# Patient Record
Sex: Female | Born: 1985 | Race: White | Hispanic: No | Marital: Married | Smoking: Never smoker
Health system: Southern US, Community
[De-identification: ages and names within clinical notes are randomized; demographics above are authoritative.]

## PROBLEM LIST (undated history)

## (undated) DIAGNOSIS — K118 Other diseases of salivary glands: Secondary | ICD-10-CM

## (undated) DIAGNOSIS — Z973 Presence of spectacles and contact lenses: Secondary | ICD-10-CM

## (undated) DIAGNOSIS — T7840XA Allergy, unspecified, initial encounter: Secondary | ICD-10-CM

## (undated) DIAGNOSIS — E669 Obesity, unspecified: Secondary | ICD-10-CM

## (undated) DIAGNOSIS — B019 Varicella without complication: Secondary | ICD-10-CM

## (undated) HISTORY — DX: Varicella without complication: B01.9

## (undated) HISTORY — DX: Allergy, unspecified, initial encounter: T78.40XA

## (undated) HISTORY — PX: WISDOM TOOTH EXTRACTION: SHX21

---

## 2014-08-20 ENCOUNTER — Ambulatory Visit (INDEPENDENT_AMBULATORY_CARE_PROVIDER_SITE_OTHER): Payer: 59 | Admitting: Internal Medicine

## 2014-08-20 ENCOUNTER — Encounter: Payer: Self-pay | Admitting: Internal Medicine

## 2014-08-20 VITALS — BP 124/82 | HR 92 | Temp 98.5°F | Ht 69.25 in | Wt 280.0 lb

## 2014-08-20 DIAGNOSIS — J302 Other seasonal allergic rhinitis: Secondary | ICD-10-CM | POA: Insufficient documentation

## 2014-08-20 DIAGNOSIS — Z0000 Encounter for general adult medical examination without abnormal findings: Secondary | ICD-10-CM

## 2014-08-20 DIAGNOSIS — K111 Hypertrophy of salivary gland: Secondary | ICD-10-CM | POA: Insufficient documentation

## 2014-08-20 LAB — CBC
HCT: 41.1 % (ref 36.0–46.0)
HEMOGLOBIN: 13.8 g/dL (ref 12.0–15.0)
MCHC: 33.7 g/dL (ref 30.0–36.0)
MCV: 86.5 fl (ref 78.0–100.0)
PLATELETS: 199 10*3/uL (ref 150.0–400.0)
RBC: 4.75 Mil/uL (ref 3.87–5.11)
RDW: 13.7 % (ref 11.5–15.5)
WBC: 5.8 10*3/uL (ref 4.0–10.5)

## 2014-08-20 LAB — LIPID PANEL
Cholesterol: 158 mg/dL (ref 0–200)
HDL: 48.9 mg/dL (ref >39.00)
LDL CALC: 93 mg/dL (ref 0–99)
NonHDL: 109.1
TRIGLYCERIDES: 82 mg/dL (ref 0.0–149.0)
Total CHOL/HDL Ratio: 3
VLDL: 16.4 mg/dL (ref 0.0–40.0)

## 2014-08-20 LAB — COMPREHENSIVE METABOLIC PANEL
ALK PHOS: 76 U/L (ref 39–117)
ALT: 13 U/L (ref 0–35)
AST: 14 U/L (ref 0–37)
Albumin: 3.8 g/dL (ref 3.5–5.2)
BUN: 11 mg/dL (ref 6–23)
CO2: 27 mEq/L (ref 19–32)
Calcium: 8.9 mg/dL (ref 8.4–10.5)
Chloride: 106 mEq/L (ref 96–112)
Creatinine, Ser: 0.68 mg/dL (ref 0.40–1.20)
GFR: 109.03 mL/min (ref >60.00)
Glucose, Bld: 89 mg/dL (ref 70–99)
Potassium: 4 mEq/L (ref 3.5–5.1)
Sodium: 139 mEq/L (ref 135–145)
Total Bilirubin: 0.5 mg/dL (ref 0.2–1.2)
Total Protein: 6.9 g/dL (ref 6.0–8.3)

## 2014-08-20 LAB — HEMOGLOBIN A1C: Hgb A1c MFr Bld: 5 % (ref 4.6–6.5)

## 2014-08-20 LAB — TSH: TSH: 0.84 u[IU]/mL (ref 0.35–4.50)

## 2014-08-20 NOTE — Progress Notes (Signed)
Pre visit review using our clinic review tool, if applicable. No additional management support is needed unless otherwise documented below in the visit note. 

## 2014-08-20 NOTE — Assessment & Plan Note (Signed)
Encouraged her to work on diet and exercise 

## 2014-08-20 NOTE — Progress Notes (Addendum)
HPI  Pt presents to the clinic today to establish care and for management of the conditions listed below. She is transferring care from Moishe Spice, [place] at [facility].  Flu: 03/2014 Tetanus: < 10 years ago LMP: 08/07/14 Pap Smear: 2012 Dentist: Biannually  Allergies: Seasonal. Will take Allegra OTC when needed.  Past Medical History  Diagnosis Date  . Chicken pox   . Allergy     Current Outpatient Prescriptions  Medication Sig Dispense Refill  . fexofenadine (ALLEGRA) 180 MG tablet Take 180 mg by mouth daily.     No current facility-administered medications for this visit.    No Known Allergies  Family History  Problem Relation Age of Onset  . Cancer Maternal Grandmother     Breast  . Alcohol abuse Maternal Grandfather   . Hyperlipidemia Paternal Grandfather     History   Social History  . Marital Status: Married    Spouse Name: N/A  . Number of Children: N/A  . Years of Education: N/A   Occupational History  . Not on file.   Social History Main Topics  . Smoking status: Never Smoker   . Smokeless tobacco: Never Used  . Alcohol Use: 0.0 oz/week    0 Standard drinks or equivalent per week     Comment: social  . Drug Use: Not on file  . Sexual Activity: Not on file   Other Topics Concern  . Not on file   Social History Narrative  . No narrative on file    ROS:  Constitutional: Denies fever, malaise, fatigue, headache or abrupt weight changes.  HEENT: Denies eye pain, eye redness, ear pain, ringing in the ears, wax buildup, runny nose, nasal congestion, bloody nose, or sore throat. Respiratory: Denies difficulty breathing, shortness of breath, cough or sputum production.   Cardiovascular: Denies chest pain, chest tightness, palpitations or swelling in the hands or feet.  Gastrointestinal: Denies abdominal pain, bloating, constipation, diarrhea or blood in the stool.  GU: Denies frequency, urgency, pain with urination, blood in urine, odor or  discharge. Musculoskeletal: Denies decrease in range of motion, difficulty with gait, muscle pain or joint pain and swelling.  Skin: Denies redness, rashes, lesions or ulcercations.  Neurological: Denies dizziness, difficulty with memory, difficulty with speech or problems with balance and coordination.  Psych: Denies anxiety, depression, SI/HI.  No other specific complaints in a complete review of systems (except as listed in HPI above).  PE:  BP 124/82 mmHg  Pulse 92  Temp(Src) 98.5 F (36.9 C) (Oral)  Ht 5' 9.25" (1.759 m)  Wt 280 lb (127.007 kg)  BMI 41.05 kg/m2  SpO2 98%  LMP 08/07/2014 Wt Readings from Last 3 Encounters:  08/20/14 280 lb (127.007 kg)    General: Appears her stated age, obese in NAD. HEENT: Head: normal shape and size; Eyes: sclera white, no icterus, conjunctiva pink, PERRLA and EOMs intact; Ears: Tm's gray and intact, normal light reflex; Nose: mucosa pink and moist, septum midline; Throat/Mouth: Teeth present, mucosa pink and moist, no lesions or ulcerations noted.  Neck: Neck supple, trachea midline. Swollen parotid gland noted on the right. No thyromegaly present.  Cardiovascular: Normal rate and rhythm. S1,S2 noted.  No murmur, rubs or gallops noted. No JVD or BLE edema. No carotid bruits noted. Pulmonary/Chest: Normal effort and positive vesicular breath sounds. No respiratory distress. No wheezes, rales or ronchi noted.  Abdomen: Soft and nontender. Normal bowel sounds, no bruits noted. No distention or masses noted. Liver, spleen and kidneys non  palpable. Musculoskeletal: Normal range of motion. Strength 5/5 BUE/BLE. No difficulty with gait.  Neurological: Alert and oriented. Cranial nerves II-XII grossly intact. Coordination normal.  Psychiatric: Mood and affect normal. Behavior is normal. Judgment and thought content normal.     Assessment and Plan:  Preventative Health Maintenance:  Encouraged her to work on diet and exercise Will check CBC,  CMET, TSH, Lipid and A1C Continue to see a dentist biannually  RTC in 1 year or sooner if needed

## 2014-08-20 NOTE — Patient Instructions (Signed)

## 2014-08-20 NOTE — Assessment & Plan Note (Signed)
On the right She has had this evaluated She opted not to do surgery Will continue to monitor

## 2014-08-20 NOTE — Assessment & Plan Note (Signed)
Continue Allegra as needed 

## 2014-09-20 ENCOUNTER — Ambulatory Visit (INDEPENDENT_AMBULATORY_CARE_PROVIDER_SITE_OTHER): Payer: 59 | Admitting: Obstetrics & Gynecology

## 2014-09-20 ENCOUNTER — Encounter: Payer: Self-pay | Admitting: Obstetrics & Gynecology

## 2014-09-20 VITALS — BP 116/80 | HR 73 | Resp 16 | Ht 69.0 in | Wt 280.0 lb

## 2014-09-20 DIAGNOSIS — Z01419 Encounter for gynecological examination (general) (routine) without abnormal findings: Secondary | ICD-10-CM | POA: Diagnosis not present

## 2014-09-20 DIAGNOSIS — Z124 Encounter for screening for malignant neoplasm of cervix: Secondary | ICD-10-CM | POA: Diagnosis not present

## 2014-09-20 DIAGNOSIS — Z1151 Encounter for screening for human papillomavirus (HPV): Secondary | ICD-10-CM

## 2014-09-20 NOTE — Progress Notes (Signed)
Subjective:    Cassidy [name] is a 29 y.o. MW G32  female who presents for an annual exam. The patient has no complaints today. The patient is sexually active. GYN screening history: last pap: was normal. The patient wears seatbelts: yes. The patient participates in regular exercise: yes. Has the patient ever been transfused or tattooed?: no. The patient reports that there is not domestic violence in her life.   Menstrual History: OB History    Gravida Para Term Preterm AB TAB SAB Ectopic Multiple Living   0 0 0 0 0 0 0 0 0 0       Menarche age: 89  Patient's last menstrual period was 09/03/2014.    The following portions of the patient's history were reviewed and updated as appropriate: allergies, current medications, past family history, past medical history, past social history, past surgical history and problem list.  Review of Systems A comprehensive review of systems was negative. 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 Married for 5 years, uses condoms for contraception, happy with this method. Denies dyspareunia. Considering pregnancy in the next year or so. MGM with breast cancer   Objective:    BP 116/80 mmHg  Pulse 73  Resp 16  Ht 5\' 9"  (1.753 m)  Wt 280 lb (127.007 kg)  BMI 41.33 kg/m2  LMP 09/03/2014  General Appearance:    Alert, cooperative, no distress, appears stated age  Head:    Normocephalic, without obvious abnormality, atraumatic  Eyes:    PERRL, conjunctiva/corneas clear, EOM's intact, fundi    benign, both eyes  Ears:    Normal TM's and external ear canals, both ears  Nose:   Nares normal, septum midline, mucosa normal, no drainage    or sinus tenderness  Throat:   Lips, mucosa, and tongue normal; teeth and gums normal  Neck:   Supple, symmetrical, trachea midline, no adenopathy;    thyroid:  no enlargement/tenderness/nodules; no carotid   bruit or JVD  Back:     Symmetric, no curvature, ROM normal, no CVA tenderness  Lungs:     Clear to auscultation  bilaterally, respirations unlabored  Chest Wall:    No tenderness or deformity   Heart:    Regular rate and rhythm, S1 and S2 normal, no murmur, rub   or gallop  Breast Exam:    No tenderness, masses, or nipple abnormality  Abdomen:     Soft, non-tender, bowel sounds active all four quadrants,    no masses, no organomegaly  Genitalia:    Normal female without lesion, discharge or tenderness     Extremities:   Extremities normal, atraumatic, no cyanosis or edema  Pulses:   2+ and symmetric all extremities  Skin:   Skin color, texture, turgor normal, no rashes or lesions  Lymph nodes:   Cervical, supraclavicular, and axillary nodes normal  Neurologic:   CNII-XII intact, normal strength, sensation and reflexes    throughout  .    Assessment:    Healthy female exam.    Plan:     Breast self exam technique reviewed and patient encouraged to perform self-exam monthly. Thin prep Pap smear.

## 2014-09-21 LAB — CYTOLOGY - PAP

## 2014-11-16 ENCOUNTER — Telehealth: Payer: Self-pay | Admitting: Internal Medicine

## 2014-11-16 DIAGNOSIS — Z7689 Persons encountering health services in other specified circumstances: Secondary | ICD-10-CM

## 2014-11-16 NOTE — Telephone Encounter (Signed)
This is in your inbox, please return to me so i can get a copy first

## 2014-11-16 NOTE — Telephone Encounter (Signed)
Done, placed in MYD box 

## 2014-11-16 NOTE — Telephone Encounter (Signed)
Pt dropped off form for school. Please call [phone] when ready to pick up, thanks. Placing on [employer]

## 2014-11-20 NOTE — Telephone Encounter (Signed)
Pt is aware--form placed in front office for pt to pick up

## 2015-05-13 ENCOUNTER — Other Ambulatory Visit (HOSPITAL_COMMUNITY): Payer: Self-pay | Admitting: Otolaryngology

## 2015-05-13 DIAGNOSIS — K118 Other diseases of salivary glands: Secondary | ICD-10-CM

## 2015-05-14 ENCOUNTER — Other Ambulatory Visit (HOSPITAL_COMMUNITY): Payer: Self-pay | Admitting: Otolaryngology

## 2015-05-14 DIAGNOSIS — K118 Other diseases of salivary glands: Secondary | ICD-10-CM

## 2015-05-20 ENCOUNTER — Ambulatory Visit (HOSPITAL_COMMUNITY): Payer: 59

## 2015-05-24 ENCOUNTER — Ambulatory Visit (HOSPITAL_COMMUNITY): Payer: 59

## 2015-07-18 ENCOUNTER — Ambulatory Visit
Admission: RE | Admit: 2015-07-18 | Discharge: 2015-07-18 | Disposition: A | Discharge status: Home / Self Care | Payer: 59 | Source: Ambulatory Visit | Attending: Otolaryngology | Admitting: Otolaryngology

## 2015-07-18 DIAGNOSIS — K118 Other diseases of salivary glands: Secondary | ICD-10-CM

## 2015-07-18 DIAGNOSIS — R590 Localized enlarged lymph nodes: Secondary | ICD-10-CM | POA: Insufficient documentation

## 2015-07-18 DIAGNOSIS — R220 Localized swelling, mass and lump, head: Secondary | ICD-10-CM | POA: Diagnosis not present

## 2015-07-18 DIAGNOSIS — R221 Localized swelling, mass and lump, neck: Secondary | ICD-10-CM | POA: Diagnosis not present

## 2015-07-18 MED ORDER — IOHEXOL 300 MG/ML  SOLN
75.0000 mL | Freq: Once | INTRAMUSCULAR | Status: AC | PRN
  Administered 2015-07-18: 75 mL via INTRAVENOUS

## 2015-08-06 DIAGNOSIS — R220 Localized swelling, mass and lump, head: Secondary | ICD-10-CM | POA: Diagnosis not present

## 2015-08-06 DIAGNOSIS — E041 Nontoxic single thyroid nodule: Secondary | ICD-10-CM | POA: Diagnosis not present

## 2015-09-25 ENCOUNTER — Other Ambulatory Visit: Payer: Self-pay | Admitting: Otolaryngology

## 2015-09-30 ENCOUNTER — Encounter (HOSPITAL_COMMUNITY): Payer: Self-pay

## 2015-09-30 ENCOUNTER — Encounter (HOSPITAL_COMMUNITY)
Admission: RE | Admit: 2015-09-30 | Discharge: 2015-09-30 | Disposition: A | Discharge status: Home / Self Care | Payer: 59 | Source: Ambulatory Visit | Attending: Otolaryngology | Admitting: Otolaryngology

## 2015-09-30 DIAGNOSIS — Z01812 Encounter for preprocedural laboratory examination: Secondary | ICD-10-CM | POA: Insufficient documentation

## 2015-09-30 DIAGNOSIS — R221 Localized swelling, mass and lump, neck: Secondary | ICD-10-CM | POA: Insufficient documentation

## 2015-09-30 HISTORY — DX: Presence of spectacles and contact lenses: Z97.3

## 2015-09-30 HISTORY — DX: Obesity, unspecified: E66.9

## 2015-09-30 HISTORY — DX: Other diseases of salivary glands: K11.8

## 2015-09-30 LAB — CBC
HEMATOCRIT: 41.7 % (ref 36.0–46.0)
Hemoglobin: 13.4 g/dL (ref 12.0–15.0)
MCH: 28.9 pg (ref 26.0–34.0)
MCHC: 32.1 g/dL (ref 30.0–36.0)
MCV: 90.1 fL (ref 78.0–100.0)
Platelets: 187 10*3/uL (ref 150–400)
RBC: 4.63 MIL/uL (ref 3.87–5.11)
RDW: 14 % (ref 11.5–15.5)
WBC: 6.5 10*3/uL (ref 4.0–10.5)

## 2015-09-30 LAB — HCG, SERUM, QUALITATIVE: PREG SERUM: NEGATIVE

## 2015-09-30 NOTE — Pre-Procedure Instructions (Signed)
Cassidy [name]  09/30/2015      [id number] - [place], [place] [address] [place] [address] Phone: [phone] Fax: [phone]    Your procedure is scheduled on Wednesday, October 09, 2015  Report to [facility] Admitting at 6:30 A.M.  Call this number if you have problems the morning of surgery:  [phone]   Remember:  Do not eat food or drink liquids after midnight Tuesday, October 08, 2015  Take these medicines the morning of surgery with A SIP OF WATER : fexofenadine (ALLEGRA) Stop taking Aspirin, vitamins, fish oil and herbal medications. Do not take any NSAIDs ie: Ibuprofen, Advil, Naproxen, BC and Goody Powder or any medication containing Aspirin: stop Wednesday, October 02, 2015.  Do not wear jewelry, make-up or nail polish.  Do not wear lotions, powders, or perfumes.  You may not wear deodorant.  Do not shave 48 hours prior to surgery.    Do not bring valuables to the hospital.  [facility] is not responsible for any belongings or valuables.  Contacts, dentures or bridgework may not be worn into surgery.  Leave your suitcase in the car.  After surgery it may be brought to your room.  For patients admitted to the hospital, discharge time will be determined by your treatment team.  Patients discharged the day of surgery will not be allowed to drive home.   Name and phone number of your driver:  Special instructions: Special Instructions:Special Instructions: [facility] - Preparing for Surgery  Before surgery, you can play an important role.  Because skin is not sterile, your skin needs to be as free of germs as possible.  You can reduce the number of germs on you skin by washing with CHG (chlorahexidine gluconate) soap before surgery.  CHG is an antiseptic cleaner which kills germs and bonds with the skin to continue killing germs even after washing.  Please DO NOT use if you have an allergy to CHG or antibacterial  soaps.  If your skin becomes reddened/irritated stop using the CHG and inform your nurse when you arrive at Short Stay.  Do not shave (including legs and underarms) for at least 48 hours prior to the first CHG shower.  You may shave your face.  Please follow these instructions carefully:   1.  Shower with CHG Soap the night before surgery and the morning of Surgery.  2.  If you choose to wash your hair, wash your hair first as usual with your normal shampoo.  3.  After you shampoo, rinse your hair and body thoroughly to remove the Shampoo.  4.  Use CHG as you would any other liquid soap.  You can apply chg directly  to the skin and wash gently with scrungie or a clean washcloth.  5.  Apply the CHG Soap to your body ONLY FROM THE NECK DOWN.  Do not use on open wounds or open sores.  Avoid contact with your eyes, ears, mouth and genitals (private parts).  Wash genitals (private parts) with your normal soap.  6.  Wash thoroughly, paying special attention to the area where your surgery will be performed.  7.  Thoroughly rinse your body with warm water from the neck down.  8.  DO NOT shower/wash with your normal soap after using and rinsing off the CHG Soap.  9.  Pat yourself dry with a clean towel.            10.  Wear clean pajamas.            11.  Place clean sheets on your bed the night of your first shower and do not sleep with pets.  Day of Surgery  Do not apply any lotions/deodorants the morning of surgery.  Please wear clean clothes to the hospital/surgery center.  Please read over the following fact sheets that you were given. Pain Booklet, Coughing and Deep Breathing and Surgical Site Infection Prevention

## 2015-09-30 NOTE — Progress Notes (Signed)
Pt denies SOB, chest pain, and being under the care of a cardiologist. Pt denies having a stress test, echo and cardiac cath. Pt denies having an EKG and chest x ray within the last year. Pt denies having recent lab work. Pt PCP is Dr. Webb Silversmith at [place], [facility] office.

## 2015-10-08 MED ORDER — DEXTROSE 5 % IV SOLN
3.0000 g | INTRAVENOUS | Status: AC
  Administered 2015-10-09: 3 g via INTRAVENOUS
  Filled 2015-10-08: qty 3000

## 2015-10-08 MED ORDER — DEXAMETHASONE SODIUM PHOSPHATE 10 MG/ML IJ SOLN
10.0000 mg | INTRAMUSCULAR | Status: AC
  Administered 2015-10-09: 10 mg via INTRAVENOUS
  Filled 2015-10-08: qty 1

## 2015-10-09 ENCOUNTER — Encounter (HOSPITAL_COMMUNITY): Payer: Self-pay | Admitting: Certified Registered Nurse Anesthetist

## 2015-10-09 ENCOUNTER — Ambulatory Visit (HOSPITAL_COMMUNITY)
Admission: RE | Admit: 2015-10-09 | Discharge: 2015-10-10 | Disposition: A | Discharge status: Home / Self Care | Payer: 59 | Source: Ambulatory Visit | Attending: Otolaryngology | Admitting: Otolaryngology

## 2015-10-09 ENCOUNTER — Ambulatory Visit (HOSPITAL_COMMUNITY): Payer: 59 | Admitting: Certified Registered Nurse Anesthetist

## 2015-10-09 ENCOUNTER — Encounter (HOSPITAL_COMMUNITY)
Admission: RE | Disposition: A | Discharge status: Home / Self Care | Payer: Self-pay | Source: Ambulatory Visit | Attending: Otolaryngology

## 2015-10-09 DIAGNOSIS — K118 Other diseases of salivary glands: Secondary | ICD-10-CM | POA: Diagnosis not present

## 2015-10-09 DIAGNOSIS — D110 Benign neoplasm of parotid gland: Secondary | ICD-10-CM | POA: Diagnosis not present

## 2015-10-09 DIAGNOSIS — K119 Disease of salivary gland, unspecified: Secondary | ICD-10-CM | POA: Diagnosis not present

## 2015-10-09 HISTORY — PX: PAROTIDECTOMY: SHX2163

## 2015-10-09 LAB — CBC
HEMATOCRIT: 42.2 % (ref 36.0–46.0)
Hemoglobin: 13.9 g/dL (ref 12.0–15.0)
MCH: 29.4 pg (ref 26.0–34.0)
MCHC: 32.9 g/dL (ref 30.0–36.0)
MCV: 89.4 fL (ref 78.0–100.0)
PLATELETS: 181 10*3/uL (ref 150–400)
RBC: 4.72 MIL/uL (ref 3.87–5.11)
RDW: 13.5 % (ref 11.5–15.5)
WBC: 6.3 10*3/uL (ref 4.0–10.5)

## 2015-10-09 LAB — CREATININE, SERUM
Creatinine, Ser: 0.63 mg/dL (ref 0.44–1.00)
GFR calc non Af Amer: >60 mL/min (ref >60)

## 2015-10-09 SURGERY — EXCISION, PAROTID GLAND
Anesthesia: General | Site: Neck | Laterality: Right

## 2015-10-09 MED ORDER — LIDOCAINE HCL (CARDIAC) 20 MG/ML IV SOLN
INTRAVENOUS | Status: DC | PRN
  Administered 2015-10-09: 50 mg via INTRAVENOUS

## 2015-10-09 MED ORDER — FENTANYL CITRATE (PF) 100 MCG/2ML IJ SOLN
INTRAMUSCULAR | Status: DC | PRN
  Administered 2015-10-09: 50 ug via INTRAVENOUS

## 2015-10-09 MED ORDER — ONDANSETRON HCL 4 MG/2ML IJ SOLN: 4.0000 mg | INTRAMUSCULAR | Status: DC | PRN

## 2015-10-09 MED ORDER — ARTIFICIAL TEARS OP OINT
TOPICAL_OINTMENT | OPHTHALMIC | Status: AC
  Filled 2015-10-09: qty 3.5

## 2015-10-09 MED ORDER — 0.9 % SODIUM CHLORIDE (POUR BTL) OPTIME
TOPICAL | Status: DC | PRN
  Administered 2015-10-09: 1000 mL

## 2015-10-09 MED ORDER — MIDAZOLAM HCL 5 MG/5ML IJ SOLN
INTRAMUSCULAR | Status: DC | PRN
  Administered 2015-10-09: 2 mg via INTRAVENOUS

## 2015-10-09 MED ORDER — PROPOFOL 10 MG/ML IV BOLUS
INTRAVENOUS | Status: DC | PRN
  Administered 2015-10-09: 150 mg via INTRAVENOUS

## 2015-10-09 MED ORDER — BACITRACIN ZINC 500 UNIT/GM EX OINT
TOPICAL_OINTMENT | CUTANEOUS | Status: AC
  Filled 2015-10-09: qty 28.35

## 2015-10-09 MED ORDER — FENTANYL CITRATE (PF) 250 MCG/5ML IJ SOLN
INTRAMUSCULAR | Status: AC
  Filled 2015-10-09: qty 5

## 2015-10-09 MED ORDER — ONDANSETRON HCL 4 MG PO TABS: 4.0000 mg | ORAL_TABLET | ORAL | Status: DC | PRN

## 2015-10-09 MED ORDER — PHENYLEPHRINE HCL 10 MG/ML IJ SOLN
10.0000 mg | INTRAMUSCULAR | Status: DC | PRN
  Administered 2015-10-09: 25 ug/min via INTRAVENOUS

## 2015-10-09 MED ORDER — MIDAZOLAM HCL 2 MG/2ML IJ SOLN
INTRAMUSCULAR | Status: AC
  Filled 2015-10-09: qty 2

## 2015-10-09 MED ORDER — PROPOFOL 10 MG/ML IV BOLUS
INTRAVENOUS | Status: AC
  Filled 2015-10-09: qty 20

## 2015-10-09 MED ORDER — HYDROMORPHONE HCL 1 MG/ML IJ SOLN: 0.2500 mg | INTRAMUSCULAR | Status: DC | PRN

## 2015-10-09 MED ORDER — SODIUM CHLORIDE 0.9 % IV SOLN
INTRAVENOUS | Status: DC
  Administered 2015-10-09: 30 mL/h via INTRAVENOUS
  Filled 2015-10-09: qty 100

## 2015-10-09 MED ORDER — LIDOCAINE-EPINEPHRINE 1 %-1:100000 IJ SOLN
INTRAMUSCULAR | Status: AC
  Filled 2015-10-09: qty 1

## 2015-10-09 MED ORDER — LIDOCAINE-EPINEPHRINE 1 %-1:100000 IJ SOLN
INTRAMUSCULAR | Status: DC | PRN
  Administered 2015-10-09: 20 mL via INTRADERMAL

## 2015-10-09 MED ORDER — HYDROCODONE-ACETAMINOPHEN 5-325 MG PO TABS: 1.0000 | ORAL_TABLET | Freq: Four times a day (QID) | ORAL | Status: DC | PRN

## 2015-10-09 MED ORDER — ZOLPIDEM TARTRATE 5 MG PO TABS: 5.0000 mg | ORAL_TABLET | Freq: Every evening | ORAL | Status: DC | PRN

## 2015-10-09 MED ORDER — HEPARIN SODIUM (PORCINE) 5000 UNIT/ML IJ SOLN
5000.0000 [IU] | Freq: Three times a day (TID) | INTRAMUSCULAR | Status: DC
  Filled 2015-10-09: qty 1

## 2015-10-09 MED ORDER — LIDOCAINE HCL (CARDIAC) 20 MG/ML IV SOLN
INTRAVENOUS | Status: AC
  Filled 2015-10-09: qty 5

## 2015-10-09 MED ORDER — LIDOCAINE HCL 4 % EX SOLN
CUTANEOUS | Status: DC | PRN
  Administered 2015-10-09: 2 mL via TOPICAL

## 2015-10-09 MED ORDER — ONDANSETRON HCL 4 MG/2ML IJ SOLN
INTRAMUSCULAR | Status: AC
  Filled 2015-10-09: qty 2

## 2015-10-09 MED ORDER — LORATADINE 10 MG PO TABS: 10.0000 mg | ORAL_TABLET | Freq: Every day | ORAL | Status: DC

## 2015-10-09 MED ORDER — SUCCINYLCHOLINE CHLORIDE 20 MG/ML IJ SOLN
INTRAMUSCULAR | Status: DC | PRN
  Administered 2015-10-09: 120 mg via INTRAVENOUS

## 2015-10-09 MED ORDER — HYDROCODONE-ACETAMINOPHEN 5-325 MG PO TABS: 1.0000 | ORAL_TABLET | ORAL | Status: DC | PRN

## 2015-10-09 MED ORDER — LACTATED RINGERS IV SOLN
INTRAVENOUS | Status: DC | PRN
  Administered 2015-10-09: 08:00:00 via INTRAVENOUS

## 2015-10-09 MED ORDER — ONDANSETRON HCL 4 MG/2ML IJ SOLN
INTRAMUSCULAR | Status: DC | PRN
  Administered 2015-10-09: 4 mg via INTRAVENOUS

## 2015-10-09 MED ORDER — DEXTROSE IN LACTATED RINGERS 5 % IV SOLN
INTRAVENOUS | Status: DC
  Administered 2015-10-09 – 2015-10-10 (×2): via INTRAVENOUS

## 2015-10-09 MED ORDER — MORPHINE SULFATE (PF) 2 MG/ML IV SOLN: 2.0000 mg | INTRAVENOUS | Status: DC | PRN

## 2015-10-09 MED ORDER — SUCCINYLCHOLINE CHLORIDE 20 MG/ML IJ SOLN
INTRAMUSCULAR | Status: AC
  Filled 2015-10-09: qty 1

## 2015-10-09 SURGICAL SUPPLY — 47 items
CANISTER SUCTION 2500CC (MISCELLANEOUS) ×2 IMPLANT
CLEANER TIP ELECTROSURG 2X2 (MISCELLANEOUS) ×2 IMPLANT
CONT SPEC 4OZ CLIKSEAL STRL BL (MISCELLANEOUS) ×2 IMPLANT
CORDS BIPOLAR (ELECTRODE) ×2 IMPLANT
COVER SURGICAL LIGHT HANDLE (MISCELLANEOUS) ×2 IMPLANT
DERMABOND ADVANCED (GAUZE/BANDAGES/DRESSINGS) ×1
DERMABOND ADVANCED .7 DNX12 (GAUZE/BANDAGES/DRESSINGS) ×1 IMPLANT
DRAIN JACKSON RD 7FR 3/32 (WOUND CARE) ×2 IMPLANT
DRAPE SURG 17X23 STRL (DRAPES) ×2 IMPLANT
DRSG TEGADERM 2-3/8X2-3/4 SM (GAUZE/BANDAGES/DRESSINGS) ×10 IMPLANT
ELECT COATED BLADE 2.86 ST (ELECTRODE) ×2 IMPLANT
ELECT PAIRED SUBDERMAL (MISCELLANEOUS) ×2
ELECT REM PT RETURN 9FT ADLT (ELECTROSURGICAL) ×2
ELECTRODE PAIRED SUBDERMAL (MISCELLANEOUS) ×1 IMPLANT
ELECTRODE REM PT RTRN 9FT ADLT (ELECTROSURGICAL) ×1 IMPLANT
EVACUATOR SILICONE 100CC (DRAIN) ×2 IMPLANT
GAUZE SPONGE 4X4 16PLY XRAY LF (GAUZE/BANDAGES/DRESSINGS) ×2 IMPLANT
GLOVE BIOGEL M 7.0 STRL (GLOVE) ×4 IMPLANT
GLOVE BIOGEL PI IND STRL 7.0 (GLOVE) ×1 IMPLANT
GLOVE BIOGEL PI INDICATOR 7.0 (GLOVE) ×1
GLOVE SURG SS PI 7.0 STRL IVOR (GLOVE) ×2 IMPLANT
GOWN STRL REUS W/ TWL LRG LVL3 (GOWN DISPOSABLE) ×2 IMPLANT
GOWN STRL REUS W/ TWL XL LVL3 (GOWN DISPOSABLE) ×1 IMPLANT
GOWN STRL REUS W/TWL LRG LVL3 (GOWN DISPOSABLE) ×2
GOWN STRL REUS W/TWL XL LVL3 (GOWN DISPOSABLE) ×1
KIT BASIN OR (CUSTOM PROCEDURE TRAY) ×2 IMPLANT
KIT ROOM TURNOVER OR (KITS) ×2 IMPLANT
NEEDLE HYPO 25GX1X1/2 BEV (NEEDLE) ×2 IMPLANT
NS IRRIG 1000ML POUR BTL (IV SOLUTION) ×2 IMPLANT
PAD ARMBOARD 7.5X6 YLW CONV (MISCELLANEOUS) ×4 IMPLANT
PENCIL BUTTON HOLSTER BLD 10FT (ELECTRODE) ×2 IMPLANT
PREP POVIDONE IODINE SPRAY 2OZ (MISCELLANEOUS) ×4 IMPLANT
SHEARS HARMONIC 9CM CVD (BLADE) ×2 IMPLANT
STAPLER VISISTAT 35W (STAPLE) ×2 IMPLANT
SUT ETHILON 3 0 PS 1 (SUTURE) ×2 IMPLANT
SUT SILK 2 0 REEL (SUTURE) ×2 IMPLANT
SUT SILK 3 0 REEL (SUTURE) ×2 IMPLANT
SUT SILK 3 0 SH CR/8 (SUTURE) ×2 IMPLANT
SUT VIC AB 4-0 SH 27 (SUTURE) ×1
SUT VIC AB 4-0 SH 27XBRD (SUTURE) ×1 IMPLANT
SUT VIC AB 5-0 P-3 18XBRD (SUTURE) ×1 IMPLANT
SUT VIC AB 5-0 P3 18 (SUTURE) ×1
SUT VIC AB 5-0 PS2 18 (SUTURE) ×2 IMPLANT
SUT VICRYL 4-0 PS2 18IN ABS (SUTURE) ×2 IMPLANT
TOWEL OR 17X24 6PK STRL BLUE (TOWEL DISPOSABLE) ×2 IMPLANT
TRAY ENT MC OR (CUSTOM PROCEDURE TRAY) ×2 IMPLANT
TUBE ENDOTRAC EMG 7X10.2 (MISCELLANEOUS) IMPLANT

## 2015-10-09 NOTE — Op Note (Signed)
[name], [name] NO.:  [id number]  MEDICAL RECORD NO.:  [id number]  LOCATION:  [facility]                         FACILITY:  [facility]  PHYSICIAN:  Early Chars. Wilburn Cornelia, M.D.DATE OF BIRTH:  10-Jun-1986  DATE OF PROCEDURE:  10/09/2015 DATE OF DISCHARGE:                              OPERATIVE REPORT   LOCATION:  [facility] Main OR.  PREOPERATIVE DIAGNOSIS:  Right parotid mass.  POSTOPERATIVE DIAGNOSIS:  Right parotid mass.  SURGICAL PROCEDURE:  Right superficial parotidectomy with facial nerve dissection (NIMS).  SURGEON:  Early Chars. Wilburn Cornelia, M.D.  ANESTHESIA:  General endotracheal.  ASSISTANT:  Jefry H. Constance Holster, MD.  COMPLICATIONS:  No complications.  BLOOD LOSS:  Approximately 20 mL.  The patient transferred from the operating room to the recovery room in stable condition.  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  She had undergone previous workup while living in [place] with benign biopsy and recommendations for surgical resection.  At that time, the patient deferred surgery and over the next several years, the mass gradually was enlarging.  She was evaluated as an outpatient and found to have a 3-cm palpable firm mass in the posterior aspect of the right parotid gland.  A CT scan was obtained, which showed a cystic-appearing mass involving the posterior-inferior aspect of the parotid gland.  Given the patient's history, previous biopsy and physical examination, I recommended parotidectomy with facial nerve dissection.  The risks and benefits of the procedure were discussed in detail with the patient and her husband.  They understood and agreed with our plan for surgery, which was scheduled on elective basis at [facility] on October 09, 2015.  DESCRIPTION OF PROCEDURE:  The patient was brought to the  operating room and placed in a supine position on the operating table.  General endotracheal anesthesia was established without difficulty.  When the patient adequately anesthetized, she was positioned and prepped and draped.  A surgical time-out was then performed with correct identification of the patient and the surgical procedure including the right laterality of the operation.  The Xomed nerve integrity monitor system (NIMS) was then placed with probes at the right orbicularis oculi and orbicularis oris muscle groups.  The nerve monitoring probe was used throughout the facial nerve dissection component of the surgical procedure.  The patient was then injected with 5 mL of 1% lidocaine, 1:100,000 solution of epinephrine in the proposed skin incision.  She was positioned, prepped and draped and prepared for surgery.  With the patient prepared, a curvilinear incision was created in the right preauricular region.  This was carried through the skin and the subcutaneous tissue.  The incision was extended inferiorly around the earlobe and curved into the upper part of the neck along the angle of the mandible.  The underlying subcutaneous tissue was then carefully elevated.  A large cystic mass was encountered immediately under the skin and this allowed a superficial dissection plane elevating the superficial facial musculature as well as skin and subcutaneous tissue anteriorly to expose the right periparotid region.  Dissection was then  carried out along the anterior border of the sternocleidomastoid muscle using Bovie electrocautery and blunt and sharp dissection.  The parotid tissue including the large cystic mass was elevated anteriorly.  Along the inferior component, the posterior belly of the digastric muscle was identified.  This was a deep margin of resection and superficial soft tissue was taken superficial and lateral to that area.  This allowed the entire posterior-inferior  aspect of the parotid gland to be easily palpated and mobile.  Dissection was then undertaken along the anterior preauricular tragus.  Dissection was carried from superficial to deep, dividing the fibrous and soft tissue and elevating the parotid gland anteriorly.  With the facial nerve monitoring system functioning, dissection was then undertaken along the facial nerve.  The main trunk of the nerve was identified after careful dissection of the overlying soft tissue using bipolar cautery and sharp dissection.  The facial nerve was identified and traced from proximal to distal.  The superior inferior divisions were identified.  Superior division was well away from the parotid cystic tumor and was left without further dissection. The entire inferior division was then carefully dissected with forceps, bipolar cautery and the Harmonic scalpel.  The superficial and inferior component of the gland was then mobilized.  The nerve branches were intact.  That entire portion of the parotid gland was then resected along the deep margin, which included the entire cystic mass as well as moderate amount of surrounding normal-appearing parotid tissue.  Several small lymph nodes were also identified and these were removed as part of the specimen, which was sent to Pathology for gross microscopic evaluation.  The patient's wound was then thoroughly irrigated.  There was no active bleeding.  The facial nerve was stimulated using the NIMS probe and all branches stimulated at 0.4 milliamps.  A 7-French round drain was then placed at the depth of the incision and the patient's surgical wound was closed in multiple layers beginning with interrupted 4-0 Vicryl suture, superficial subcutaneous layer closed with 5-0 interrupted Vicryl suture and final skin edge was closed with Dermabond surgical glue.  The patient was then awakened from her anesthetic.  She was extubated and was transferred from the operating room  to the recovery room in stable condition.  There were no complications.  Blood loss was approximately 20 mL.          ______________________________ Early Chars. Wilburn Cornelia, M.D.     DLS/MEDQ  D:  [id number]  T:  10/09/2015  Job:  [id number]

## 2015-10-09 NOTE — Anesthesia Preprocedure Evaluation (Signed)
Anesthesia Evaluation  Patient identified by MRN, date of birth, ID band Patient awake    Reviewed: Allergy & Precautions, NPO status , Patient's Chart, lab work & pertinent test results  Airway Mallampati: II  TM Distance: <3 FB Neck ROM: Full    Dental no notable dental hx.    Pulmonary neg pulmonary ROS,    Pulmonary exam normal breath sounds clear to auscultation       Cardiovascular negative cardio ROS Normal cardiovascular exam Rhythm:Regular Rate:Normal     Neuro/Psych negative neurological ROS  negative psych ROS   GI/Hepatic negative GI ROS, Neg liver ROS,   Endo/Other  Morbid obesity  Renal/GU negative Renal ROS  negative genitourinary   Musculoskeletal negative musculoskeletal ROS (+)   Abdominal   Peds negative pediatric ROS (+)  Hematology negative hematology ROS (+)   Anesthesia Other Findings   Reproductive/Obstetrics negative OB ROS                             Anesthesia Physical Anesthesia Plan  ASA: II  Anesthesia Plan: General   Post-op Pain Management:    Induction: Intravenous  Airway Management Planned: Oral ETT  Additional Equipment:   Intra-op Plan:   Post-operative Plan: Extubation in OR  Informed Consent: I have reviewed the patients History and Physical, chart, labs and discussed the procedure including the risks, benefits and alternatives for the proposed anesthesia with the patient or authorized representative who has indicated his/her understanding and acceptance.   Dental advisory given  Plan Discussed with: CRNA and Surgeon  Anesthesia Plan Comments:         Anesthesia Quick Evaluation

## 2015-10-09 NOTE — Brief Op Note (Signed)
10/09/2015  10:50 AM  PATIENT:  Cassidy [name]  30 y.o. female  PRE-OPERATIVE DIAGNOSIS:  RIGHT PAROTIC MASS  POST-OPERATIVE DIAGNOSIS:  RIGHT PAROTIC MASS  PROCEDURE:  Procedure(s): PAROTIDECTOMY WITH FACIAL NERVE DISSECTION (Right)  SURGEON:  Surgeon(s) and Role:    * Jerrell Belfast, MD - Primary    * Izora Gala, MD - Assisting  PHYSICIAN ASSISTANT:   ASSISTANTS: Dr. Constance Holster   ANESTHESIA:   general  EBL:  Total I/O In: -  Out: 20 [Blood:20]  BLOOD ADMINISTERED:none  DRAINS: (7 Fr) Jackson-Pratt drain(s) with closed bulb suction in the Right neck    LOCAL MEDICATIONS USED:  LIDOCAINE  and Amount: 5 ml  SPECIMEN:  Source of Specimen:  Right parotid mass  DISPOSITION OF SPECIMEN:  PATHOLOGY  COUNTS:  YES  TOURNIQUET:  * No tourniquets in log *  DICTATION: .Other Dictation: Dictation Number [id number]  PLAN OF CARE: Admit for overnight observation  PATIENT DISPOSITION:  PACU - hemodynamically stable.   Delay start of Pharmacological VTE agent (>24hrs) due to surgical blood loss or risk of bleeding: no

## 2015-10-09 NOTE — Progress Notes (Signed)
Patient ID: Cassidy [name], female   DOB: [birth year], 30 y.o.   MRN: [id number] Doing well VII intact. Drain working. Wound excellent. Taking po well

## 2015-10-09 NOTE — H&P (Signed)
Cassidy [name] is an 30 y.o. female.   Chief Complaint: Right Parotid Mass HPI: progressive Right Parotid Mass, bx benign.  Past Medical History  Diagnosis Date  . Chicken pox   . Allergy   . Wears glasses   . Parotid mass     right  . Obesity     Past Surgical History  Procedure Laterality Date  . Wisdom tooth extraction      Family History  Problem Relation Age of Onset  . Cancer Maternal Grandmother     Breast  . Alcohol abuse Maternal Grandfather   . Hyperlipidemia Paternal Grandfather   . Cancer Mother    Social History:  reports that she has never smoked. She has never used smokeless tobacco. She reports that she drinks alcohol. She reports that she does not use illicit drugs.  Allergies: No Known Allergies  Medications Prior to Admission  Medication Sig Dispense Refill  . fexofenadine (ALLEGRA) 180 MG tablet Take 180 mg by mouth daily as needed for allergies.       No results found for this or any previous visit (from the past 48 hour(s)). No results found.  Review of Systems  Constitutional: Negative.   HENT: Negative.   Respiratory: Negative.   Cardiovascular: Negative.   Gastrointestinal: Negative.     Blood pressure 120/81, pulse 75, temperature 97.7 F (36.5 C), resp. rate 20, weight 121.564 kg (268 lb), last menstrual period 09/28/2015, SpO2 100 %. Physical Exam  Constitutional: She appears well-developed and well-nourished.  HENT:  Right Parotid mass  Neck: Normal range of motion. Neck supple.  Cardiovascular: Normal rate.   Respiratory: Effort normal.  GI: Soft.     Assessment/Plan Adm for Rt Parotidectomy with FN dissection  Cassidy Lonardo, MD 10/09/2015, 8:29 AM

## 2015-10-09 NOTE — Transfer of Care (Signed)
Immediate Anesthesia Transfer of Care Note  Patient: Cassidy [name]  Procedure(s) Performed: Procedure(s): PAROTIDECTOMY WITH FACIAL NERVE DISSECTION (Right)  Patient Location: PACU  Anesthesia Type:General  Level of Consciousness: awake, alert , oriented, patient cooperative and responds to stimulation  Airway & Oxygen Therapy: Patient Spontanous Breathing and Patient connected to nasal cannula oxygen  Post-op Assessment: Report given to RN, Post -op Vital signs reviewed and stable, Patient moving all extremities X 4 and Patient able to stick tongue midline  Post vital signs: Reviewed and stable  Last Vitals:  Filed Vitals:   10/09/15 0737  BP: 120/81  Pulse: 75  Temp: 36.5 C  Resp: 20    Complications: No apparent anesthesia complications

## 2015-10-09 NOTE — Progress Notes (Signed)
Pt felt she did not need sacral foam prophylaxis, purpose explained with understanding verbalized.

## 2015-10-09 NOTE — Anesthesia Postprocedure Evaluation (Signed)
Anesthesia Post Note  Patient: Cassidy [name]  Procedure(s) Performed: Procedure(s) (LRB): PAROTIDECTOMY WITH FACIAL NERVE DISSECTION (Right)  Patient location during evaluation: PACU Anesthesia Type: General Level of consciousness: awake and alert Pain management: pain level controlled Vital Signs Assessment: post-procedure vital signs reviewed and stable Respiratory status: spontaneous breathing, nonlabored ventilation and respiratory function stable Cardiovascular status: blood pressure returned to baseline and stable Postop Assessment: no signs of nausea or vomiting Anesthetic complications: no    Last Vitals:  Filed Vitals:   10/09/15 1205 10/09/15 1315  BP: 122/80 122/82  Pulse: 72 82  Temp:  36.6 C  Resp: 15 16    Last Pain: There were no vitals filed for this visit.               Tiajuana Amass

## 2015-10-10 ENCOUNTER — Encounter (HOSPITAL_COMMUNITY): Payer: Self-pay | Admitting: Otolaryngology

## 2015-10-10 DIAGNOSIS — D110 Benign neoplasm of parotid gland: Secondary | ICD-10-CM | POA: Diagnosis not present

## 2015-10-10 NOTE — Progress Notes (Signed)
   ENT Progress Note: POD # 1 s/p Procedure(s): PAROTIDECTOMY WITH FACIAL NERVE DISSECTION   Subjective: Min pain, tol po  Objective: Vital signs in last 24 hours: Temp:  [97.3 F (36.3 C)-98.2 F (36.8 C)] 97.9 F (36.6 C) (04/20 0521) Pulse Rate:  [72-97] 73 (04/20 0521) Resp:  [11-22] 18 (04/20 0521) BP: (104-123)/(53-82) 105/61 mmHg (04/20 0521) SpO2:  [93 %-100 %] 100 % (04/20 0521) Weight change:  Last BM Date: 10/07/15  Intake/Output from previous day: 04/19 0701 - 04/20 0700 In: 1661.2 [P.O.:480; I.V.:1131.2; IV Piggyback:50] Out: [id number] [Urine:3370; Drains:55; Blood:20] Intake/Output this shift: Total I/O In: 1071.3 [I.V.:1071.3] Out: 0   Labs:  Recent Labs  10/09/15 1255  WBC 6.3  HGB 13.9  HCT 42.2  PLT 181   No results for input(s): NA, K, CL, CO2, GLUCOSE, BUN, CALCIUM in the last 72 hours.  Invalid input(s): CREATININR  Studies/Results: No results found.   PHYSICAL EXAM: Inc intact, no swelling or erythema JP removed Nl FN   Assessment/Plan: Stable D/C to home    Pasha Broad 10/10/2015, 9:52 AM

## 2015-10-10 NOTE — Discharge Summary (Signed)
  Physician Discharge Summary  Patient ID: Cassidy [name] MRN: [id number] DOB/AGE: 1986-01-25 30 y.o.  Admit date: 10/09/2015 Discharge date: 10/10/2015  Admission Diagnoses:  Principal Problem:   Parotid mass   Discharge Diagnoses:  Same  Surgeries: Procedure(s): Bowen on 10/09/2015   Consultants: None  Discharged Condition: Improved  Hospital Course: Cassidy [name] is an 30 y.o. female who was admitted 10/09/2015 with a diagnosis of Parotid mass and went to the operating room on 10/09/2015 and underwent the above named procedures.   Pt stable post op. D/C to home POD #1.  Recent vital signs:  Filed Vitals:   10/10/15 0221 10/10/15 0521  BP: 104/53 105/61  Pulse: 72 73  Temp: 97.3 F (36.3 C) 97.9 F (36.6 C)  Resp: 18 18    Recent laboratory studies:  Results for orders placed or performed during the hospital encounter of 10/09/15  CBC  Result Value Ref Range   WBC 6.3 4.0 - 10.5 K/uL   RBC 4.72 3.87 - 5.11 MIL/uL   Hemoglobin 13.9 12.0 - 15.0 g/dL   HCT 42.2 36.0 - 46.0 %   MCV 89.4 78.0 - 100.0 fL   MCH 29.4 26.0 - 34.0 pg   MCHC 32.9 30.0 - 36.0 g/dL   RDW 13.5 11.5 - 15.5 %   Platelets 181 150 - 400 K/uL  Creatinine, serum  Result Value Ref Range   Creatinine, Ser 0.63 0.44 - 1.00 mg/dL   GFR calc non Af Amer >60 >60 mL/min   GFR calc Af Amer >60 >60 mL/min    Discharge Medications:     Medication List    TAKE these medications        fexofenadine 180 MG tablet  Commonly known as:  ALLEGRA  Take 180 mg by mouth daily as needed for allergies.     HYDROcodone-acetaminophen 5-325 MG tablet  Commonly known as:  NORCO  Take 1-2 tablets by mouth every 6 (six) hours as needed.        Diagnostic Studies: No results found.  Disposition: Final discharge disposition not confirmed      Discharge Instructions    Diet - low sodium heart healthy    Complete by:  As directed      Diet - low sodium heart  healthy    Complete by:  As directed      Discharge instructions    Complete by:  As directed   1. Limited activity 2. Liquid and soft diet, advance as tolerated 3. May bathe and shower day after surgery 4. Wound care - Gentle cleaning with soap and water 5. DO NOT APPLY ANY OINTMENT 6. Elevate Head of Bed     Increase activity slowly    Complete by:  As directed      Increase activity slowly    Complete by:  As directed            Follow-up Information    Follow up with Ormond Lazo, MD In 10 days.   Specialty:  Otolaryngology   Contact information:   [address] [place] [address] [phone]        Signed: Jerrell Belfast 10/10/2015, 9:57 AM

## 2016-11-10 DIAGNOSIS — Z6838 Body mass index (BMI) 38.0-38.9, adult: Secondary | ICD-10-CM | POA: Diagnosis not present

## 2016-11-10 DIAGNOSIS — Z01419 Encounter for gynecological examination (general) (routine) without abnormal findings: Secondary | ICD-10-CM | POA: Diagnosis not present

## 2016-11-20 DIAGNOSIS — H16223 Keratoconjunctivitis sicca, not specified as Sjogren's, bilateral: Secondary | ICD-10-CM | POA: Diagnosis not present

## 2016-11-20 DIAGNOSIS — H04123 Dry eye syndrome of bilateral lacrimal glands: Secondary | ICD-10-CM | POA: Diagnosis not present

## 2016-11-20 DIAGNOSIS — H5213 Myopia, bilateral: Secondary | ICD-10-CM | POA: Diagnosis not present

## 2016-11-20 DIAGNOSIS — H52223 Regular astigmatism, bilateral: Secondary | ICD-10-CM | POA: Diagnosis not present

## 2016-12-15 DIAGNOSIS — Z6838 Body mass index (BMI) 38.0-38.9, adult: Secondary | ICD-10-CM | POA: Diagnosis not present

## 2016-12-15 DIAGNOSIS — E041 Nontoxic single thyroid nodule: Secondary | ICD-10-CM | POA: Diagnosis not present

## 2016-12-15 DIAGNOSIS — E669 Obesity, unspecified: Secondary | ICD-10-CM | POA: Diagnosis not present

## 2016-12-15 DIAGNOSIS — Z0000 Encounter for general adult medical examination without abnormal findings: Secondary | ICD-10-CM | POA: Diagnosis not present

## 2017-06-25 IMAGING — CT CT NECK W/ CM
2 of 3 series · 8 of 14 positions shown, 9 images · IV contrast (omnipaque)
Comparison: None.

CLINICAL DATA: Parotid mass.  Right neck swelling for 3 years.

EXAM:
CT NECK WITH CONTRAST
TECHNIQUE: Multidetector CT imaging of the neck was performed using the
standard protocol following the bolus administration of intravenous
contrast.
CONTRAST:  75mL OMNIPAQUE IOHEXOL 300 MG/ML  SOLN

[Series 2: axial neck · axial · 0.50mm/px · z∈[-285,-137]mm · 4 of 124 slices shown]
[im 25/124  bone]
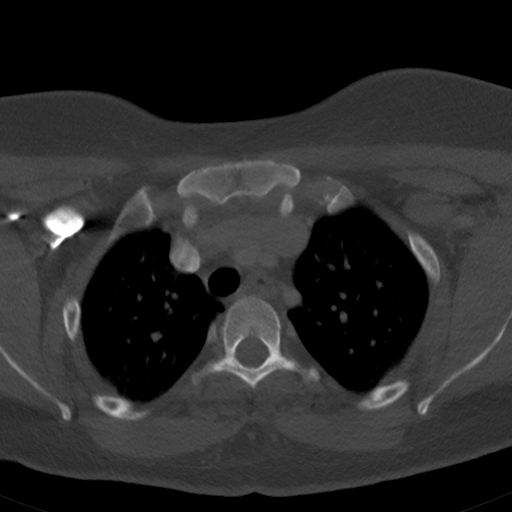
[im 50/124  bone]
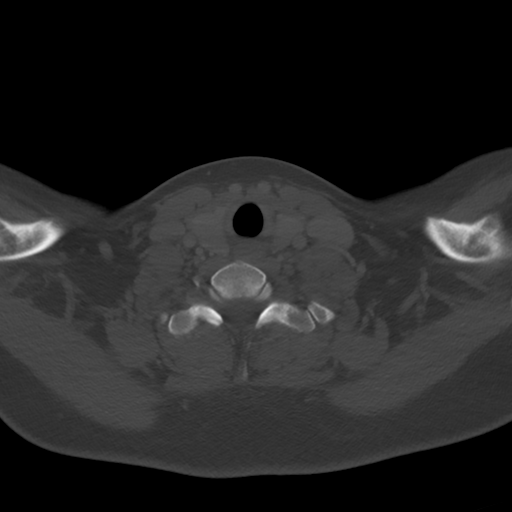
[im 74/124  bone]
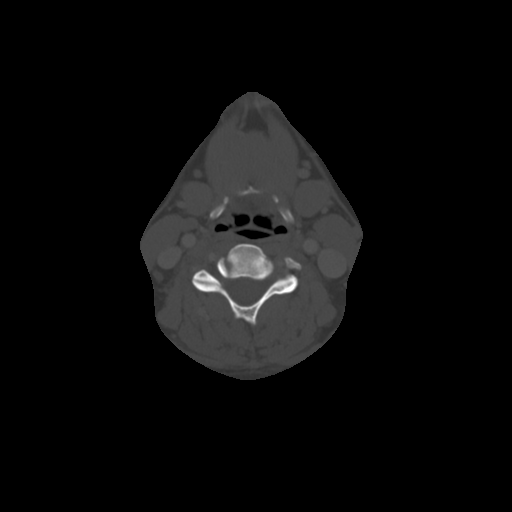
[im 99/124  bone]
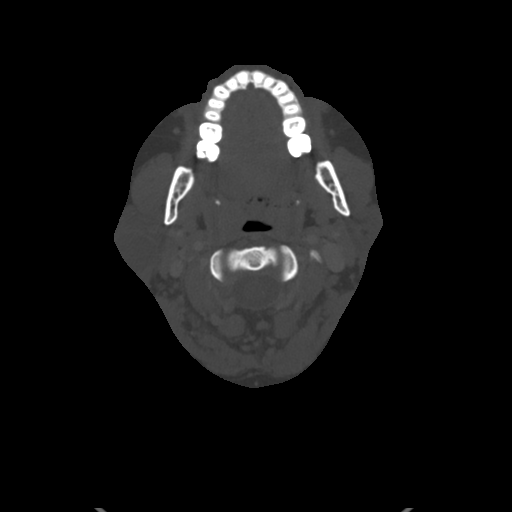

[Series 7: orthogonal ax · axial · 0.46mm/px · z∈[-289,-137]mm · 4 of 128 slices shown, 5 images]
[im 26/128  soft-tissue]
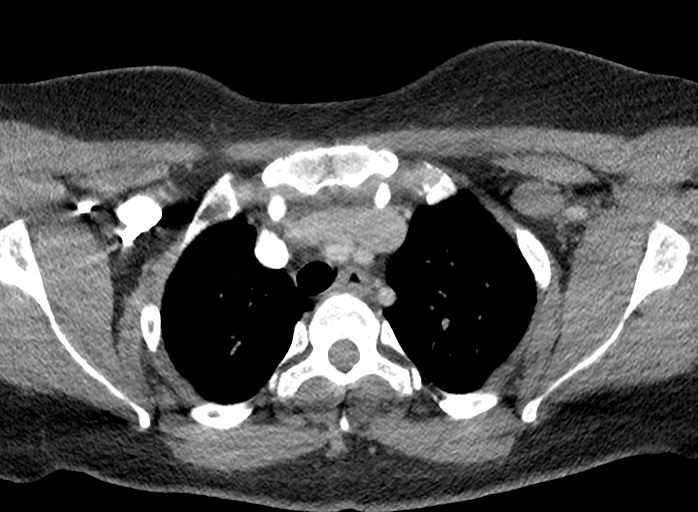
[im 26/128  bone]
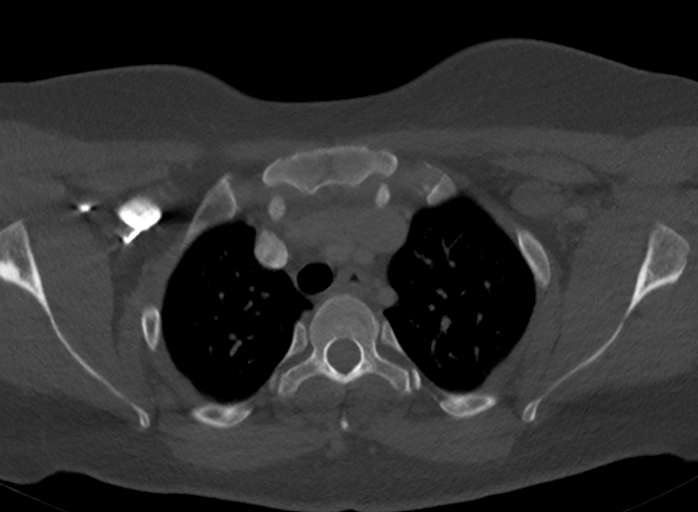
[im 51/128  bone]
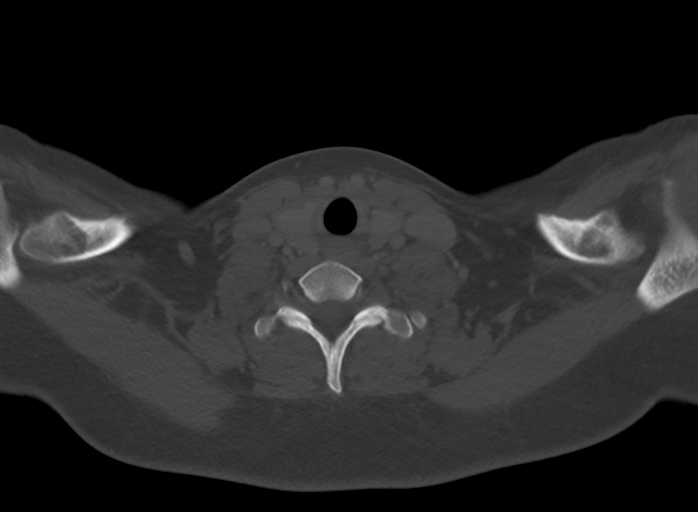
[im 77/128  bone]
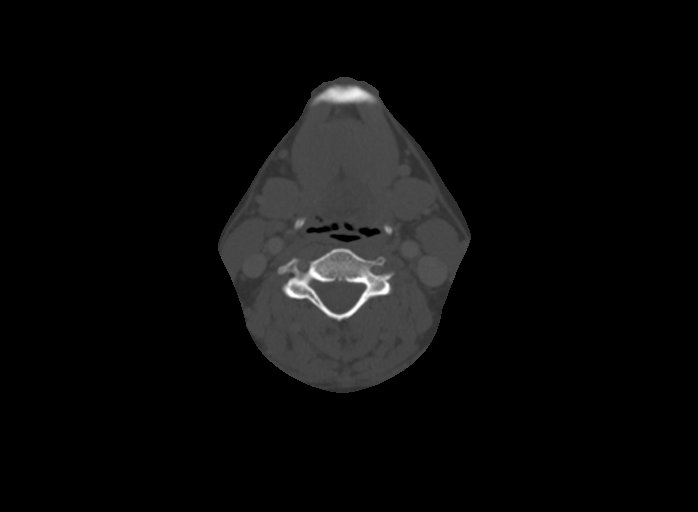
[im 102/128  bone]
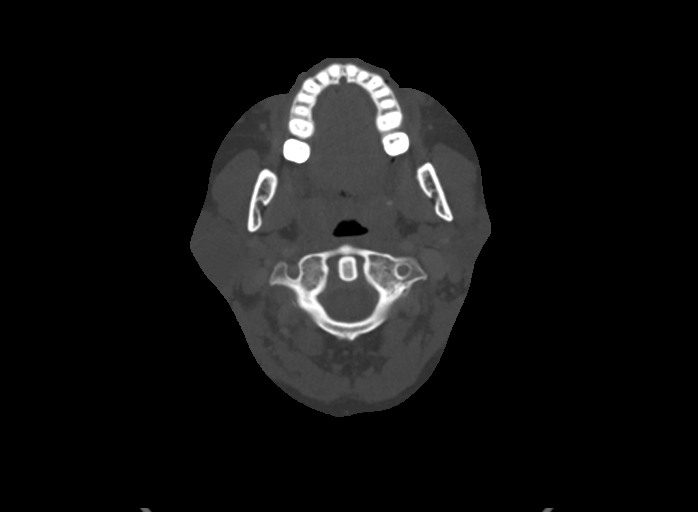

[8 of 14 positions shown; findings below may reference images not displayed]

FINDINGS: Pharynx and larynx: There is mild prominence of the palatine tonsils
bilaterally. Calcifications are present within the tonsils
bilaterally. There is no discrete mass lesion. No focal mucosal or
submucosal lesions are present. Vocal cords are midline and
symmetric.

Salivary glands: A low-density mass in the posterior right parotid
gland measures 2.4 x 2.6 x 3.7 cm. This appears to be within the
right parotid gland. No additional parotid lesions are present. The
submandibular glands are within normal limits bilaterally.

Thyroid: A 4 mm hypodense nodule is present in the upper pole of the
right lobe of the thyroid. The thyroid is otherwise within normal
limits.

Lymph nodes: Enlarged bilateral jugulodigastric lymph nodes are
present. Sub cm bilateral submandibular nodes are present. There are
multiple other small level 2 and level 3 lymph nodes without other
enlarged nodes.

Vascular: Negative

Limited intracranial: Negative

Visualized orbits: Within normal limits

Mastoids and visualized paranasal sinuses: Clear

Skeleton: Negative

Upper chest: Clear
IMPRESSION: 1. Well-circumscribed 2.4 x 2.6 x 3.7 cm posterior right parotid
mass. This is most compatible with a benign neoplasm such as a
Warthin's tumor or less likely a benign mixed tumor. There is no
evidence for malignancy.
2. Mild prominence of bilateral level 2 lymph nodes are likely
reactive.

## 2017-11-11 DIAGNOSIS — Z01419 Encounter for gynecological examination (general) (routine) without abnormal findings: Secondary | ICD-10-CM | POA: Diagnosis not present

## 2017-11-11 DIAGNOSIS — Z6839 Body mass index (BMI) 39.0-39.9, adult: Secondary | ICD-10-CM | POA: Diagnosis not present

## 2017-12-16 DIAGNOSIS — E7800 Pure hypercholesterolemia, unspecified: Secondary | ICD-10-CM | POA: Diagnosis not present

## 2017-12-16 DIAGNOSIS — Z0000 Encounter for general adult medical examination without abnormal findings: Secondary | ICD-10-CM | POA: Diagnosis not present

## 2017-12-16 DIAGNOSIS — E669 Obesity, unspecified: Secondary | ICD-10-CM | POA: Diagnosis not present

## 2017-12-16 DIAGNOSIS — E041 Nontoxic single thyroid nodule: Secondary | ICD-10-CM | POA: Diagnosis not present

## 2018-01-03 DIAGNOSIS — H5213 Myopia, bilateral: Secondary | ICD-10-CM | POA: Diagnosis not present

## 2018-01-03 DIAGNOSIS — H52223 Regular astigmatism, bilateral: Secondary | ICD-10-CM | POA: Diagnosis not present

## 2019-04-05 LAB — OB RESULTS CONSOLE ABO/RH: RH Type: POSITIVE

## 2019-04-05 LAB — OB RESULTS CONSOLE GC/CHLAMYDIA
Chlamydia: NEGATIVE
Gonorrhea: NEGATIVE

## 2019-04-05 LAB — OB RESULTS CONSOLE HIV ANTIBODY (ROUTINE TESTING): HIV: NONREACTIVE

## 2019-04-05 LAB — OB RESULTS CONSOLE RPR: RPR: NONREACTIVE

## 2019-04-05 LAB — OB RESULTS CONSOLE HEPATITIS B SURFACE ANTIGEN: Hepatitis B Surface Ag: NEGATIVE

## 2019-04-05 LAB — OB RESULTS CONSOLE RUBELLA ANTIBODY, IGM: Rubella: IMMUNE

## 2019-04-05 LAB — OB RESULTS CONSOLE ANTIBODY SCREEN: Antibody Screen: NEGATIVE

## 2019-06-23 NOTE — L&D Delivery Note (Signed)
Operative Delivery Note At 1:38 AM a viable female was delivered via Vaginal, Vacuum Neurosurgeon).  Presentation: vertex; Position: Left,, Occiput,, Anterior; Station: +2.  After 3 hours of pushing and maternal exhaustion, patient was no longer able to push effectively.  FHT Category I.  Patient was offered Vacuum assistance and accepted.   Verbal consent: obtained from patient.  Risks and benefits discussed in detail.  Risks include, but are not limited to the risks of anesthesia, bleeding, infection, damage to maternal tissues, fetal cephalhematoma.  There is also the risk of inability to effect vaginal delivery of the head, or shoulder dystocia that cannot be resolved by established maneuvers, leading to the need for emergency cesarean section.  APGAR: 9, 9; weight pending.   Placenta status: S, I.   3V Cord with the following complications: none.  Cord pH: n/a  Anesthesia:  CLEA Instruments: Kiwi vacuum (3 pulls with each of 4 contractions; no pop-offs) Episiotomy: None Lacerations: Vaginal;Periurethral Suture Repair: 3.0 vicryl rapide Est. Blood Loss (mL):  250  Mom to postpartum.  Baby to Couplet care / Skin to Skin.  [name] 11/15/2019, 1:57 AM

## 2019-10-04 ENCOUNTER — Encounter (HOSPITAL_COMMUNITY): Payer: Self-pay | Admitting: Obstetrics and Gynecology

## 2019-10-04 ENCOUNTER — Inpatient Hospital Stay (HOSPITAL_COMMUNITY)
Admission: AD | Admit: 2019-10-04 | Discharge: 2019-10-04 | Disposition: A | Discharge status: Home / Self Care | Payer: Managed Care, Other (non HMO) | Attending: Obstetrics and Gynecology | Admitting: Obstetrics and Gynecology

## 2019-10-04 ENCOUNTER — Other Ambulatory Visit: Payer: Self-pay

## 2019-10-04 DIAGNOSIS — O26893 Other specified pregnancy related conditions, third trimester: Secondary | ICD-10-CM | POA: Diagnosis not present

## 2019-10-04 DIAGNOSIS — O368130 Decreased fetal movements, third trimester, not applicable or unspecified: Secondary | ICD-10-CM | POA: Diagnosis not present

## 2019-10-04 DIAGNOSIS — O4700 False labor before 37 completed weeks of gestation, unspecified trimester: Secondary | ICD-10-CM

## 2019-10-04 DIAGNOSIS — Z3A34 34 weeks gestation of pregnancy: Secondary | ICD-10-CM | POA: Insufficient documentation

## 2019-10-04 DIAGNOSIS — O99891 Other specified diseases and conditions complicating pregnancy: Secondary | ICD-10-CM | POA: Diagnosis not present

## 2019-10-04 DIAGNOSIS — O368190 Decreased fetal movements, unspecified trimester, not applicable or unspecified: Secondary | ICD-10-CM | POA: Diagnosis present

## 2019-10-04 DIAGNOSIS — O4703 False labor before 37 completed weeks of gestation, third trimester: Secondary | ICD-10-CM | POA: Insufficient documentation

## 2019-10-04 DIAGNOSIS — O479 False labor, unspecified: Secondary | ICD-10-CM

## 2019-10-04 DIAGNOSIS — R030 Elevated blood-pressure reading, without diagnosis of hypertension: Secondary | ICD-10-CM | POA: Diagnosis not present

## 2019-10-04 MED ORDER — NIFEDIPINE 10 MG PO CAPS
10.0000 mg | ORAL_CAPSULE | ORAL | Status: DC | PRN
  Administered 2019-10-04 (×2): 10 mg via ORAL
  Filled 2019-10-04 (×2): qty 1

## 2019-10-04 NOTE — MAU Provider Note (Signed)
Chief Complaint:  Decreased Fetal Movement   First Provider Initiated Contact with Patient 10/04/19 0551     HPI: Cassidy [name] is a 34 y.o. G1P0000 at 46w2dwho presents to maternity admissions reporting decreased fetal movement since 2100hrs.  Concerned about fetal wellbeing and swollen ankles. Crying on admission due to worry over baby .Has had cramping for a week or so but states Dr in office told her it was probably just baby moving.   She denies LOF, vaginal bleeding, vaginal itching/burning, urinary symptoms, h/a, dizziness, n/v, diarrhea, constipation or fever/chills.  She denies headache, visual changes or RUQ abdominal pain.  Abdominal Pain This is a recurrent problem. The current episode started in the past 7 days. The onset quality is gradual. The problem occurs intermittently. The problem has been unchanged. The pain is located in the suprapubic region, LLQ and RLQ. The quality of the pain is cramping. The abdominal pain does not radiate. Pertinent negatives include no constipation, diarrhea, dysuria, fever, frequency or headaches. Associated symptoms comments: No fetal movement . Nothing aggravates the pain. The pain is relieved by nothing. She has tried nothing for the symptoms.    RN Note: PT SAYS LAST TIME BABY MOVED WAS 9PM.  IN TRIAGE - FHR- WAS 134.  SAYS ANKLES ARE SWOLLEN . [place]  WITH DR MORRIS.   Past Medical History: Past Medical History:  Diagnosis Date  . Allergy   . Chicken pox   . Obesity   . Parotid mass    right  . Wears glasses     Past obstetric history: OB History  Gravida Para Term Preterm AB Living  1 0 0 0 0 0  SAB TAB Ectopic Multiple Live Births  0 0 0 0      # Outcome Date GA Lbr Len/2nd Weight Sex Delivery Anes PTL Lv  1 Current             Past Surgical History: Past Surgical History:  Procedure Laterality Date  . PAROTIDECTOMY Right 10/09/2015   Procedure: PAROTIDECTOMY WITH FACIAL NERVE DISSECTION;  Surgeon: Jerrell Belfast, MD;   Location: [facility];  Service: ENT;  Laterality: Right;  . WISDOM TOOTH EXTRACTION      Family History: Family History  Problem Relation Age of Onset  . Cancer Mother   . Cancer Maternal Grandmother        Breast  . Alcohol abuse Maternal Grandfather   . Hyperlipidemia Paternal Grandfather     Social History: Social History   Tobacco Use  . Smoking status: Never Smoker  . Smokeless tobacco: Never Used  Substance Use Topics  . Alcohol use: Yes    Alcohol/week: 0.0 standard drinks    Comment: social  . Drug use: No    Allergies: No Known Allergies  Meds:  Medications Prior to Admission  Medication Sig Dispense Refill Last Dose  . Prenatal Vit-Fe Fumarate-FA (PRENATAL MULTIVITAMIN) TABS tablet Take 1 tablet by mouth daily at 12 noon.   10/03/2019 at Unknown time  . fexofenadine (ALLEGRA) 180 MG tablet Take 180 mg by mouth daily as needed for allergies.      Marland Kitchen HYDROcodone-acetaminophen (NORCO) 5-325 MG tablet Take 1-2 tablets by mouth every 6 (six) hours as needed. 30 tablet 0     I have reviewed patient's Past Medical Hx, Surgical Hx, Family Hx, Social Hx, medications and allergies.   ROS:  Review of Systems  Constitutional: Negative for fever.  Gastrointestinal: Positive for abdominal pain. Negative for constipation and diarrhea.  Genitourinary: Negative for dysuria and frequency.  Neurological: Negative for headaches.   Other systems negative  Physical Exam   Patient Vitals for the past 24 hrs:  BP Temp Temp src Pulse Resp Height Weight  10/04/19 0536 140/81 -- -- 95 -- -- --  10/04/19 0526 133/87 97.9 F (36.6 C) Oral 90 20 5\' 10"  (1.778 m) 135 kg   Vitals:   10/04/19 0602 10/04/19 0620 10/04/19 0643 10/04/19 0659  BP: 133/86 128/84 113/68 113/68  Pulse: 84   97  Resp:      Temp:      TempSrc:      Weight:      Height:        Constitutional: Well-developed, well-nourished female in no acute distress.  Cardiovascular: normal rate and rhythm Respiratory:  normal effort, clear to auscultation bilaterally GI: Abd soft, non-tender, gravid appropriate for gestational age.   No rebound or guarding. MS: Extremities nontender, 1+ ankle edema, normal ROM Neurologic: Alert and oriented x 4.   DTRs 1+ with no clonus GU: Neg CVAT.  PELVIC EXAM:Dilation: Closed Effacement (%): 80 Cervical Position: Middle Station: -3 Presentation: Vertex Exam by:: Jimmye Norman CNM   FHT:  Baseline 140 , moderate variability, accelerations present, no decelerations Contractions: Uterine contractions every minute, lasting 30-40 seconds each   Labs: No results found for this or any previous visit (from the past 24 hour(s)).   Imaging:  No results found.  MAU Course/MDM: First BP elevated with subsequent ones normal   I think the first was elevated due to pt crying.   NST reviewed and has been beautifully reactive throughout.   Treatments in MAU included We gave her some Procardia for the mild contractions/cramps since her cervix was so effaced.  She received two doses with almost complete resolution of UCs and pain. Discussed signs of PTL and good hydration.  Recommend she call office to notify them of her visit.    Assessment: Single IUP at [redacted]w[redacted]d Preterm uterine contractions with cervical effacement of 80% Decreased perception of fetal movement, improved with resolution of contractions Borderline single hypertension reading  Plan: Discharge home Preterm Labor precautions and fetal kick counts Follow up in Office for prenatal visits and recheck of cervix  Encouraged to return here or to other Urgent Care/ED if she develops worsening of symptoms, increase in pain, fever, or other concerning symptoms.   Pt stable at time of discharge.  Hansel Feinstein CNM, MSN Certified Nurse-Midwife 10/04/2019 5:51 AM

## 2019-10-04 NOTE — MAU Note (Signed)
PT SAYS LAST TIME BABY MOVED WAS 9PM.  IN TRIAGE - FHR- WAS 134.  SAYS ANKLES ARE SWOLLEN . [place]  WITH DR MORRIS.

## 2019-10-04 NOTE — Discharge Instructions (Signed)
Third Trimester of Pregnancy The third trimester is from week 28 through week 40 (months 7 through 9). The third trimester is a time when the unborn baby (fetus) is growing rapidly. At the end of the ninth month, the fetus is about 20 inches in length and weighs 6-10 pounds. Body changes during your third trimester Your body will continue to go through many changes during pregnancy. The changes vary from woman to woman. During the third trimester:  Your weight will continue to increase. You can expect to gain 25-35 pounds (11-16 kg) by the end of the pregnancy.  You may begin to get stretch marks on your hips, abdomen, and breasts.  You may urinate more often because the fetus is moving lower into your pelvis and pressing on your bladder.  You may develop or continue to have heartburn. This is caused by increased hormones that slow down muscles in the digestive tract.  You may develop or continue to have constipation because increased hormones slow digestion and cause the muscles that push waste through your intestines to relax.  You may develop hemorrhoids. These are swollen veins (varicose veins) in the rectum that can itch or be painful.  You may develop swollen, bulging veins (varicose veins) in your legs.  You may have increased body aches in the pelvis, back, or thighs. This is due to weight gain and increased hormones that are relaxing your joints.  You may have changes in your hair. These can include thickening of your hair, rapid growth, and changes in texture. Some women also have hair loss during or after pregnancy, or hair that feels dry or thin. Your hair will most likely return to normal after your baby is born.  Your breasts will continue to grow and they will continue to become tender. A yellow fluid (colostrum) may leak from your breasts. This is the first milk you are producing for your baby.  Your belly button may stick out.  You may notice more swelling in your hands,  face, or ankles.  You may have increased tingling or numbness in your hands, arms, and legs. The skin on your belly may also feel numb.  You may feel short of breath because of your expanding uterus.  You may have more problems sleeping. This can be caused by the size of your belly, increased need to urinate, and an increase in your body's metabolism.  You may notice the fetus "dropping," or moving lower in your abdomen (lightening).  You may have increased vaginal discharge.  You may notice your joints feel loose and you may have pain around your pelvic bone. What to expect at prenatal visits You will have prenatal exams every 2 weeks until week 36. Then you will have weekly prenatal exams. During a routine prenatal visit:  You will be weighed to make sure you and the baby are growing normally.  Your blood pressure will be taken.  Your abdomen will be measured to track your baby's growth.  The fetal heartbeat will be listened to.  Any test results from the previous visit will be discussed.  You may have a cervical check near your due date to see if your cervix has softened or thinned (effaced).  You will be tested for Group B streptococcus. This happens between 35 and 37 weeks. Your health care provider may ask you:  What your birth plan is.  How you are feeling.  If you are feeling the baby move.  If you have had any abnormal   symptoms, such as leaking fluid, bleeding, severe headaches, or abdominal cramping.  If you are using any tobacco products, including cigarettes, chewing tobacco, and electronic cigarettes.  If you have any questions. Other tests or screenings that may be performed during your third trimester include:  Blood tests that check for low iron levels (anemia).  Fetal testing to check the health, activity level, and growth of the fetus. Testing is done if you have certain medical conditions or if there are problems during the pregnancy.  Nonstress test  (NST). This test checks the health of your baby to make sure there are no signs of problems, such as the baby not getting enough oxygen. During this test, a belt is placed around your belly. The baby is made to move, and its heart rate is monitored during movement. What is false labor? False labor is a condition in which you feel small, irregular tightenings of the muscles in the womb (contractions) that usually go away with rest, changing position, or drinking water. These are called Braxton Hicks contractions. Contractions may last for hours, days, or even weeks before true labor sets in. If contractions come at regular intervals, become more frequent, increase in intensity, or become painful, you should see your health care provider. What are the signs of labor?  Abdominal cramps.  Regular contractions that start at 10 minutes apart and become stronger and more frequent with time.  Contractions that start on the top of the uterus and spread down to the lower abdomen and back.  Increased pelvic pressure and dull back pain.  A watery or bloody mucus discharge that comes from the vagina.  Leaking of amniotic fluid. This is also known as your "water breaking." It could be a slow trickle or a gush. Let your health care provider know if it has a color or strange odor. If you have any of these signs, call your health care provider right away, even if it is before your due date. Follow these instructions at home: Medicines  Follow your health care provider's instructions regarding medicine use. Specific medicines may be either safe or unsafe to take during pregnancy.  Take a prenatal vitamin that contains at least 600 micrograms (mcg) of folic acid.  If you develop constipation, try taking a stool softener if your health care provider approves. Eating and drinking   Eat a balanced diet that includes fresh fruits and vegetables, whole grains, good sources of protein such as meat, eggs, or tofu,  and low-fat dairy. Your health care provider will help you determine the amount of weight gain that is right for you.  Avoid raw meat and uncooked cheese. These carry germs that can cause birth defects in the baby.  If you have low calcium intake from food, talk to your health care provider about whether you should take a daily calcium supplement.  Eat four or five small meals rather than three large meals a day.  Limit foods that are high in fat and processed sugars, such as fried and sweet foods.  To prevent constipation: ? Drink enough fluid to keep your urine clear or pale yellow. ? Eat foods that are high in fiber, such as fresh fruits and vegetables, whole grains, and beans. Activity  Exercise only as directed by your health care provider. Most women can continue their usual exercise routine during pregnancy. Try to exercise for 30 minutes at least 5 days a week. Stop exercising if you experience uterine contractions.  Avoid heavy lifting.  Do   not exercise in extreme heat or humidity, or at high altitudes.  Wear low-heel, comfortable shoes.  Practice good posture.  You may continue to have sex unless your health care provider tells you otherwise. Relieving pain and discomfort  Take frequent breaks and rest with your legs elevated if you have leg cramps or low back pain.  Take warm sitz baths to soothe any pain or discomfort caused by hemorrhoids. Use hemorrhoid cream if your health care provider approves.  Wear a good support bra to prevent discomfort from breast tenderness.  If you develop varicose veins: ? Wear support pantyhose or compression stockings as told by your healthcare provider. ? Elevate your feet for 15 minutes, 3-4 times a day. Prenatal care  Write down your questions. Take them to your prenatal visits.  Keep all your prenatal visits as told by your health care provider. This is important. Safety  Wear your seat belt at all times when driving.  Make  a list of emergency phone numbers, including numbers for family, friends, the hospital, and police and fire departments. General instructions  Avoid cat litter boxes and soil used by cats. These carry germs that can cause birth defects in the baby. If you have a cat, ask someone to clean the litter box for you.  Do not travel far distances unless it is absolutely necessary and only with the approval of your health care provider.  Do not use hot tubs, steam rooms, or saunas.  Do not drink alcohol.  Do not use any products that contain nicotine or tobacco, such as cigarettes and e-cigarettes. If you need help quitting, ask your health care provider.  Do not use any medicinal herbs or unprescribed drugs. These chemicals affect the formation and growth of the baby.  Do not douche or use tampons or scented sanitary pads.  Do not cross your legs for long periods of time.  To prepare for the arrival of your baby: ? Take prenatal classes to understand, practice, and ask questions about labor and delivery. ? Make a trial run to the hospital. ? Visit the hospital and tour the maternity area. ? Arrange for maternity or paternity leave through employers. ? Arrange for family and friends to take care of pets while you are in the hospital. ? Purchase a rear-facing car seat and make sure you know how to install it in your car. ? Pack your hospital bag. ? Prepare the baby's nursery. Make sure to remove all pillows and stuffed animals from the baby's crib to prevent suffocation.  Visit your dentist if you have not gone during your pregnancy. Use a soft toothbrush to brush your teeth and be gentle when you floss. Contact a health care provider if:  You are unsure if you are in labor or if your water has broken.  You become dizzy.  You have mild pelvic cramps, pelvic pressure, or nagging pain in your abdominal area.  You have lower back pain.  You have persistent nausea, vomiting, or  diarrhea.  You have an unusual or bad smelling vaginal discharge.  You have pain when you urinate. Get help right away if:  Your water breaks before 37 weeks.  You have regular contractions less than 5 minutes apart before 37 weeks.  You have a fever.  You are leaking fluid from your vagina.  You have spotting or bleeding from your vagina.  You have severe abdominal pain or cramping.  You have rapid weight loss or weight gain.  You have   shortness of breath with chest pain.  You notice sudden or extreme swelling of your face, hands, ankles, feet, or legs.  Your baby makes fewer than 10 movements in 2 hours.  You have severe headaches that do not go away when you take medicine.  You have vision changes. Summary  The third trimester is from week 28 through week 40, months 7 through 9. The third trimester is a time when the unborn baby (fetus) is growing rapidly.  During the third trimester, your discomfort may increase as you and your baby continue to gain weight. You may have abdominal, leg, and back pain, sleeping problems, and an increased need to urinate.  During the third trimester your breasts will keep growing and they will continue to become tender. A yellow fluid (colostrum) may leak from your breasts. This is the first milk you are producing for your baby.  False labor is a condition in which you feel small, irregular tightenings of the muscles in the womb (contractions) that eventually go away. These are called Braxton Hicks contractions. Contractions may last for hours, days, or even weeks before true labor sets in.  Signs of labor can include: abdominal cramps; regular contractions that start at 10 minutes apart and become stronger and more frequent with time; watery or bloody mucus discharge that comes from the vagina; increased pelvic pressure and dull back pain; and leaking of amniotic fluid. This information is not intended to replace advice given to you by your  health care provider. Make sure you discuss any questions you have with your health care provider. Document Revised: 09/29/2018 Document Reviewed: 07/14/2016 Elsevier Patient Education  [place]. Preterm Labor and Birth Information Pregnancy normally lasts 39-41 weeks. Preterm labor is when labor starts early. It starts before you have been pregnant for 37 whole weeks. What are the risk factors for preterm labor? Preterm labor is more likely to occur in women who:  Have an infection while pregnant.  Have a cervix that is short.  Have gone into preterm labor before.  Have had surgery on their cervix.  Are younger than age 43.  Are older than age 27.  Are African American.  Are pregnant with two or more babies.  Take street drugs while pregnant.  Smoke while pregnant.  Do not gain enough weight while pregnant.  Got pregnant right after another pregnancy. What are the symptoms of preterm labor? Symptoms of preterm labor include:  Cramps. The cramps may feel like the cramps some women get during their period. The cramps may happen with watery poop (diarrhea).  Pain in the belly (abdomen).  Pain in the lower back.  Regular contractions or tightening. It may feel like your belly is getting tighter.  Pressure in the lower belly that seems to get stronger.  More fluid (discharge) leaking from the vagina. The fluid may be watery or bloody.  Water breaking. Why is it important to notice signs of preterm labor? Babies who are born early may not be fully developed. They have a higher chance for:  Long-term heart problems.  Long-term lung problems.  Trouble controlling body systems, like breathing.  Bleeding in the brain.  A condition called cerebral palsy.  Learning difficulties.  Death. These risks are highest for babies who are born before 45 weeks of pregnancy. How is preterm labor treated? Treatment depends on:  How long you were pregnant.  Your  condition.  The health of your baby. Treatment may involve:  Having a stitch (suture)  placed in your cervix. When you give birth, your cervix opens so the baby can come out. The stitch keeps the cervix from opening too soon.  Staying at the hospital.  Taking or getting medicines, such as: ? Hormone medicines. ? Medicines to stop contractions. ? Medicines to help the baby's lungs develop. ? Medicines to prevent your baby from having cerebral palsy. What should I do if I am in preterm labor? If you think you are going into labor too soon, call your doctor right away. How can I prevent preterm labor?  Do not use any tobacco products. ? Examples of these are cigarettes, chewing tobacco, and e-cigarettes. ? If you need help quitting, ask your doctor.  Do not use street drugs.  Do not use any medicines unless you ask your doctor if they are safe for you.  Talk with your doctor before taking any herbal supplements.  Make sure you gain enough weight.  Watch for infection. If you think you might have an infection, get it checked right away.  If you have gone into preterm labor before, tell your doctor. This information is not intended to replace advice given to you by your health care provider. Make sure you discuss any questions you have with your health care provider. Document Revised: 09/30/2018 Document Reviewed: 10/30/2015 Elsevier Patient Education  [place].

## 2019-10-17 LAB — OB RESULTS CONSOLE GBS: GBS: POSITIVE

## 2019-11-08 ENCOUNTER — Encounter (HOSPITAL_COMMUNITY): Payer: Self-pay | Admitting: *Deleted

## 2019-11-08 ENCOUNTER — Telehealth (HOSPITAL_COMMUNITY): Payer: Self-pay | Admitting: *Deleted

## 2019-11-08 NOTE — Telephone Encounter (Signed)
Preadmission screen  

## 2019-11-11 ENCOUNTER — Other Ambulatory Visit (HOSPITAL_COMMUNITY)
Admission: RE | Admit: 2019-11-11 | Discharge: 2019-11-11 | Disposition: A | Discharge status: Home / Self Care | Payer: Managed Care, Other (non HMO) | Source: Ambulatory Visit | Attending: Obstetrics & Gynecology | Admitting: Obstetrics & Gynecology

## 2019-11-11 DIAGNOSIS — Z20822 Contact with and (suspected) exposure to covid-19: Secondary | ICD-10-CM | POA: Insufficient documentation

## 2019-11-11 DIAGNOSIS — Z01812 Encounter for preprocedural laboratory examination: Secondary | ICD-10-CM | POA: Insufficient documentation

## 2019-11-11 LAB — SARS CORONAVIRUS 2 (TAT 6-24 HRS): SARS Coronavirus 2: NEGATIVE

## 2019-11-14 ENCOUNTER — Inpatient Hospital Stay (HOSPITAL_COMMUNITY): Payer: Managed Care, Other (non HMO) | Admitting: Anesthesiology

## 2019-11-14 ENCOUNTER — Inpatient Hospital Stay (HOSPITAL_COMMUNITY): Payer: Managed Care, Other (non HMO)

## 2019-11-14 ENCOUNTER — Encounter (HOSPITAL_COMMUNITY): Payer: Self-pay | Admitting: Obstetrics & Gynecology

## 2019-11-14 ENCOUNTER — Other Ambulatory Visit: Payer: Self-pay

## 2019-11-14 ENCOUNTER — Inpatient Hospital Stay (HOSPITAL_COMMUNITY)
Admission: AD | Admit: 2019-11-14 | Discharge: 2019-11-16 | DRG: 807 | Disposition: A | Discharge status: Home / Self Care | Payer: Managed Care, Other (non HMO) | Attending: Obstetrics & Gynecology | Admitting: Obstetrics & Gynecology

## 2019-11-14 DIAGNOSIS — O480 Post-term pregnancy: Secondary | ICD-10-CM | POA: Diagnosis present

## 2019-11-14 DIAGNOSIS — E669 Obesity, unspecified: Secondary | ICD-10-CM | POA: Diagnosis present

## 2019-11-14 DIAGNOSIS — Z20822 Contact with and (suspected) exposure to covid-19: Secondary | ICD-10-CM | POA: Diagnosis present

## 2019-11-14 DIAGNOSIS — O99824 Streptococcus B carrier state complicating childbirth: Secondary | ICD-10-CM | POA: Diagnosis present

## 2019-11-14 DIAGNOSIS — O99214 Obesity complicating childbirth: Secondary | ICD-10-CM | POA: Diagnosis present

## 2019-11-14 DIAGNOSIS — Z3490 Encounter for supervision of normal pregnancy, unspecified, unspecified trimester: Secondary | ICD-10-CM

## 2019-11-14 DIAGNOSIS — Z3A40 40 weeks gestation of pregnancy: Secondary | ICD-10-CM

## 2019-11-14 DIAGNOSIS — O26893 Other specified pregnancy related conditions, third trimester: Secondary | ICD-10-CM | POA: Diagnosis present

## 2019-11-14 LAB — CBC
HCT: 38.2 % (ref 36.0–46.0)
Hemoglobin: 12.8 g/dL (ref 12.0–15.0)
MCH: 30.7 pg (ref 26.0–34.0)
MCHC: 33.5 g/dL (ref 30.0–36.0)
MCV: 91.6 fL (ref 80.0–100.0)
Platelets: 180 10*3/uL (ref 150–400)
RBC: 4.17 MIL/uL (ref 3.87–5.11)
RDW: 13.2 % (ref 11.5–15.5)
WBC: 9 10*3/uL (ref 4.0–10.5)
nRBC: 0 % (ref 0.0–0.2)

## 2019-11-14 LAB — TYPE AND SCREEN
ABO/RH(D): O POS
Antibody Screen: NEGATIVE

## 2019-11-14 LAB — ABO/RH: ABO/RH(D): O POS

## 2019-11-14 LAB — RPR: RPR Ser Ql: NONREACTIVE

## 2019-11-14 MED ORDER — OXYTOCIN 40 UNITS IN NORMAL SALINE INFUSION - SIMPLE MED
1.0000 m[IU]/min | INTRAVENOUS | Status: DC
  Administered 2019-11-14: 2 m[IU]/min via INTRAVENOUS

## 2019-11-14 MED ORDER — ONDANSETRON HCL 4 MG/2ML IJ SOLN: 4.0000 mg | Freq: Four times a day (QID) | INTRAMUSCULAR | Status: DC | PRN

## 2019-11-14 MED ORDER — PHENYLEPHRINE 40 MCG/ML (10ML) SYRINGE FOR IV PUSH (FOR BLOOD PRESSURE SUPPORT): 80.0000 ug | PREFILLED_SYRINGE | INTRAVENOUS | Status: DC | PRN

## 2019-11-14 MED ORDER — SODIUM CHLORIDE 0.9 % IV SOLN
5.0000 10*6.[IU] | Freq: Once | INTRAVENOUS | Status: AC
  Administered 2019-11-14: 5 10*6.[IU] via INTRAVENOUS
  Filled 2019-11-14: qty 5

## 2019-11-14 MED ORDER — ACETAMINOPHEN 325 MG PO TABS: 650.0000 mg | ORAL_TABLET | ORAL | Status: DC | PRN

## 2019-11-14 MED ORDER — LACTATED RINGERS IV SOLN: 500.0000 mL | INTRAVENOUS | Status: DC | PRN

## 2019-11-14 MED ORDER — OXYTOCIN BOLUS FROM INFUSION
500.0000 mL | Freq: Once | INTRAVENOUS | Status: AC
  Administered 2019-11-15: 500 mL via INTRAVENOUS

## 2019-11-14 MED ORDER — OXYCODONE-ACETAMINOPHEN 5-325 MG PO TABS: 1.0000 | ORAL_TABLET | ORAL | Status: DC | PRN

## 2019-11-14 MED ORDER — LIDOCAINE HCL (PF) 1 % IJ SOLN
INTRAMUSCULAR | Status: DC | PRN
  Administered 2019-11-14: 10 mL via EPIDURAL
  Administered 2019-11-14: 2 mL via EPIDURAL

## 2019-11-14 MED ORDER — DIPHENHYDRAMINE HCL 50 MG/ML IJ SOLN: 12.5000 mg | INTRAMUSCULAR | Status: DC | PRN

## 2019-11-14 MED ORDER — PENICILLIN G POT IN DEXTROSE 60000 UNIT/ML IV SOLN
3.0000 10*6.[IU] | INTRAVENOUS | Status: DC
  Administered 2019-11-14 (×3): 3 10*6.[IU] via INTRAVENOUS
  Filled 2019-11-14 (×3): qty 50

## 2019-11-14 MED ORDER — TERBUTALINE SULFATE 1 MG/ML IJ SOLN: 0.2500 mg | Freq: Once | INTRAMUSCULAR | Status: DC | PRN

## 2019-11-14 MED ORDER — EPHEDRINE 5 MG/ML INJ: 10.0000 mg | INTRAVENOUS | Status: DC | PRN

## 2019-11-14 MED ORDER — LIDOCAINE HCL (PF) 1 % IJ SOLN: 30.0000 mL | INTRAMUSCULAR | Status: DC | PRN

## 2019-11-14 MED ORDER — ZOLPIDEM TARTRATE 5 MG PO TABS: 5.0000 mg | ORAL_TABLET | Freq: Every evening | ORAL | Status: DC | PRN

## 2019-11-14 MED ORDER — FENTANYL-BUPIVACAINE-NACL 0.5-0.125-0.9 MG/250ML-% EP SOLN
12.0000 mL/h | EPIDURAL | Status: DC | PRN
  Filled 2019-11-14: qty 250

## 2019-11-14 MED ORDER — FENTANYL CITRATE (PF) 100 MCG/2ML IJ SOLN: 50.0000 ug | INTRAMUSCULAR | Status: DC | PRN

## 2019-11-14 MED ORDER — LACTATED RINGERS IV SOLN
500.0000 mL | Freq: Once | INTRAVENOUS | Status: AC
  Administered 2019-11-14: 500 mL via INTRAVENOUS

## 2019-11-14 MED ORDER — OXYCODONE-ACETAMINOPHEN 5-325 MG PO TABS: 2.0000 | ORAL_TABLET | ORAL | Status: DC | PRN

## 2019-11-14 MED ORDER — MISOPROSTOL 25 MCG QUARTER TABLET
25.0000 ug | ORAL_TABLET | ORAL | Status: DC | PRN
  Administered 2019-11-14 (×2): 25 ug via VAGINAL
  Filled 2019-11-14 (×2): qty 1

## 2019-11-14 MED ORDER — SODIUM CHLORIDE (PF) 0.9 % IJ SOLN
INTRAMUSCULAR | Status: DC | PRN
  Administered 2019-11-14: 12 mL/h via EPIDURAL

## 2019-11-14 MED ORDER — SOD CITRATE-CITRIC ACID 500-334 MG/5ML PO SOLN: 30.0000 mL | ORAL | Status: DC | PRN

## 2019-11-14 MED ORDER — OXYTOCIN 40 UNITS IN NORMAL SALINE INFUSION - SIMPLE MED
2.5000 [IU]/h | INTRAVENOUS | Status: DC
  Administered 2019-11-15: 2.5 [IU]/h via INTRAVENOUS
  Filled 2019-11-14: qty 1000

## 2019-11-14 MED ORDER — LACTATED RINGERS IV SOLN: INTRAVENOUS | Status: DC

## 2019-11-14 NOTE — Progress Notes (Signed)
Cassidy [name] is a 34 y.o. G1P0000 at [redacted]w[redacted]d by ultrasound admitted for induction of labor due to Elective at term.  Subjective: Feeling increased discomfort with CTX  Objective: BP 112/90   Pulse 81   Temp 97.8 F (36.6 C) (Axillary)   Resp 18   Ht 5\' 10"  (1.778 m)   Wt (!) 137 kg   BMI 43.33 kg/m  No intake/output data recorded. No intake/output data recorded.  FHT:  FHR: 125 bpm, variability: moderate,  accelerations:  Present,  decelerations:  Absent UC:   irregular, every 3-5 minutes SVE:   Dilation: 2 Effacement (%): 80 Station: -2 Exam by:: Dr Cassidy [name]  AROM clear  Labs: Lab Results  Component Value Date   WBC 9.0 11/14/2019   HGB 12.8 11/14/2019   HCT 38.2 11/14/2019   MCV 91.6 11/14/2019   PLT 180 11/14/2019    Assessment / Plan: Induction of labor; s/p VMP x 2 and now AROM  Labor: Progressing normally and pitocin if no progress Preeclampsia:  n/a Fetal Wellbeing:  Category I Pain Control:  Epidural I/D:  n/a Anticipated MOD:  NSVD  Cassidy [name] 11/14/2019, 9:53 AM

## 2019-11-14 NOTE — Progress Notes (Signed)
Cassidy [name] is a 34 y.o. G1P0000 at [redacted]w[redacted]d by ultrasound admitted for induction of labor due to Elective at term.  Subjective: Recently received CLEA; comfortable.  Objective: BP 116/76   Pulse 71   Temp 98.8 F (37.1 C) (Oral)   Resp 18   Ht 5\' 10"  (1.778 m)   Wt (!) 137 kg   BMI 43.33 kg/m  No intake/output data recorded. No intake/output data recorded.  FHT:  FHR: 120 bpm, variability: moderate,  accelerations:  Present,  decelerations:  Absent UC:   regular, every 3 minutes SVE:   Dilation: 2 Effacement (%): 80 Station: -2 Exam by:: Wess Botts RNC  Labs: Lab Results  Component Value Date   WBC 9.0 11/14/2019   HGB 12.8 11/14/2019   HCT 38.2 11/14/2019   MCV 91.6 11/14/2019   PLT 180 11/14/2019    Assessment / Plan: Induction of labor due to maternal request,  progressing well on pitocin  Labor: Progressing normally Preeclampsia:  n/a Fetal Wellbeing:  Category I Pain Control:  Epidural I/D:  n/a Anticipated MOD:  NSVD  [name] 11/14/2019, 1:36 PM

## 2019-11-14 NOTE — Anesthesia Procedure Notes (Signed)
Epidural Patient location during procedure: OB Start time: 11/14/2019 12:51 PM End time: 11/14/2019 1:00 PM  Staffing Anesthesiologist: Pervis Hocking, DO Performed: anesthesiologist   Preanesthetic Checklist Completed: patient identified, IV checked, risks and benefits discussed, monitors and equipment checked, pre-op evaluation and timeout performed  Epidural Patient position: sitting Prep: DuraPrep and site prepped and draped Patient monitoring: continuous pulse ox, blood pressure, heart rate and cardiac monitor Approach: midline Location: L3-L4 Injection technique: LOR air  Needle:  Needle type: Tuohy  Needle gauge: 17 G Needle length: 9 cm Needle insertion depth: 7 cm Catheter type: closed end flexible Catheter size: 19 Gauge Catheter at skin depth: 14 cm Test dose: negative  Assessment Sensory level: T8 Events: blood not aspirated, injection not painful, no injection resistance, no paresthesia and negative IV test  Additional Notes Patient identified. Risks/Benefits/Options discussed with patient including but not limited to bleeding, infection, nerve damage, paralysis, failed block, incomplete pain control, headache, blood pressure changes, nausea, vomiting, reactions to medication both or allergic, itching and postpartum back pain. Confirmed with bedside nurse the patient's most recent platelet count. Confirmed with patient that they are not currently taking any anticoagulation, have any bleeding history or any family history of bleeding disorders. Patient expressed understanding and wished to proceed. All questions were answered. Sterile technique was used throughout the entire procedure. Please see nursing notes for vital signs. Test dose was given through epidural catheter and negative prior to continuing to dose epidural or start infusion. Warning signs of high block given to the patient including shortness of breath, tingling/numbness in hands, complete motor  block, or any concerning symptoms with instructions to call for help. Patient was given instructions on fall risk and not to get out of bed. All questions and concerns addressed with instructions to call with any issues or inadequate analgesia.  Reason for block:procedure for pain

## 2019-11-14 NOTE — H&P (Signed)
Cassidy [name] is a 34 y.o. female G1 at [redacted]w[redacted]d presenting for elective IOL.  Patient received second dose of VMP at 0528 and is feeling mild cramping.  Active FM.  No antepartum complications aside from obesity.  GBS positive.  OB History    Gravida  1   Para  0   Term  0   Preterm  0   AB  0   Living  0     SAB  0   TAB  0   Ectopic  0   Multiple  0   Live Births             Past Medical History:  Diagnosis Date  . Allergy   . Chicken pox   . Obesity   . Parotid mass    right  . Wears glasses    Past Surgical History:  Procedure Laterality Date  . PAROTIDECTOMY Right 10/09/2015   Procedure: PAROTIDECTOMY WITH FACIAL NERVE DISSECTION;  Surgeon: Jerrell Belfast, MD;  Location: [facility];  Service: ENT;  Laterality: Right;  . WISDOM TOOTH EXTRACTION     Family History: family history includes Alcohol abuse in her maternal grandfather; Cancer in her maternal grandmother and mother; Hyperlipidemia in her paternal grandfather. Social History:  reports that she has never smoked. She has never used smokeless tobacco. She reports current alcohol use. She reports that she does not use drugs.     Maternal Diabetes: No Genetic Screening: Normal Maternal Ultrasounds/Referrals: Normal Fetal Ultrasounds or other Referrals:  None Maternal Substance Abuse:  No Significant Maternal Medications:  None Significant Maternal Lab Results:  Group B Strep positive Other Comments:  None  Review of Systems Maternal Medical History:  Contractions: Frequency: irregular.   Perceived severity is mild.    Fetal activity: Perceived fetal activity is normal.   Last perceived fetal movement was within the past hour.    Prenatal complications: no prenatal complications Prenatal Complications - Diabetes: none.    Dilation: 1.5 Effacement (%): 80 Station: -3 Exam by:: MScott, RN Blood pressure 112/90, pulse 81, temperature 97.8 F (36.6 C), temperature source Axillary, resp. rate  18, height 5\' 10"  (1.778 m), weight (!) 137 kg. Maternal Exam:  Uterine Assessment: Contraction strength is mild.  Contraction frequency is irregular.   Abdomen: Patient reports no abdominal tenderness. Fundal height is c/w dates.   Estimated fetal weight is 8#.       Fetal Exam Fetal Monitor Review: Baseline rate: 120.  Variability: moderate (6-25 bpm).   Pattern: accelerations present and no decelerations.    Fetal State Assessment: Category I - tracings are normal.     Physical Exam  Constitutional: She is oriented to person, place, and time. She appears well-developed and well-nourished.  HENT:  Head: Normocephalic.  Respiratory: Effort normal.  GI: Soft. There is no rebound and no guarding.  Musculoskeletal:     Cervical back: Normal range of motion.  Neurological: She is alert and oriented to person, place, and time.  Skin: Skin is warm and dry.  Psychiatric: She has a normal mood and affect. Her behavior is normal.    Prenatal labs: ABO, Rh: --/--/O POS, O POS Performed at [facility], [place] [address], [place], [place] [address]  [phone]/25 0100) Antibody: NEG (05/25 0100) Rubella: Immune (10/14 0000) RPR: Nonreactive (10/14 0000)  HBsAg: Negative (10/14 0000)  HIV: Non-reactive (10/14 0000)  GBS: Positive/-- (04/27 0000)   Assessment/Plan: 33yo G1 at [redacted]w[redacted]d for IOL -Recheck cvx at  0930 and AROM if able -PCN for GBS ppx -Epidural if desired -Anticipate NSVD   [name] 11/14/2019, 8:44 AM

## 2019-11-14 NOTE — Anesthesia Preprocedure Evaluation (Signed)
Anesthesia Evaluation  Patient identified by MRN, date of birth, ID band Patient awake    Reviewed: Allergy & Precautions, Patient's Chart, lab work & pertinent test results  Airway Mallampati: II  TM Distance: >3 FB Neck ROM: Full    Dental no notable dental hx.    Pulmonary neg pulmonary ROS,    Pulmonary exam normal breath sounds clear to auscultation       Cardiovascular negative cardio ROS Normal cardiovascular exam Rhythm:Regular Rate:Normal     Neuro/Psych negative neurological ROS  negative psych ROS   GI/Hepatic negative GI ROS, Neg liver ROS,   Endo/Other  Morbid obesityBMI 43  Renal/GU negative Renal ROS  negative genitourinary   Musculoskeletal negative musculoskeletal ROS (+)   Abdominal   Peds negative pediatric ROS (+)  Hematology negative hematology ROS (+) hct 38.2, plt 180   Anesthesia Other Findings   Reproductive/Obstetrics (+) Pregnancy                             Anesthesia Physical Anesthesia Plan  ASA: III and emergent  Anesthesia Plan: Epidural   Post-op Pain Management:    Induction:   PONV Risk Score and Plan: 2  Airway Management Planned: Natural Airway  Additional Equipment: None  Intra-op Plan:   Post-operative Plan:   Informed Consent: I have reviewed the patients History and Physical, chart, labs and discussed the procedure including the risks, benefits and alternatives for the proposed anesthesia with the patient or authorized representative who has indicated his/her understanding and acceptance.       Plan Discussed with:   Anesthesia Plan Comments:         Anesthesia Quick Evaluation

## 2019-11-14 NOTE — Progress Notes (Signed)
Cassidy [name] is a 34 y.o. G1P0000 at [redacted]w[redacted]d by ultrasound admitted for induction of labor due to Elective at term.  Subjective: Comfortable with CLEA; pushing x 20 minutes.  Objective: BP 110/77   Pulse 82   Temp 98.3 F (36.8 C) (Oral)   Resp 18   Ht 5\' 10"  (1.778 m)   Wt (!) 137 kg   BMI 43.33 kg/m  I/O last 3 completed shifts: In: -  Out: [id number] [Urine:875] Total I/O In: -  Out: 325 [Urine:325]  FHT:  FHR: 130 bpm, variability: moderate,  accelerations:  Present,  decelerations:  Absent UC:   regular, every 2-3 minutes SVE:   Dilation: 10 Effacement (%): 100 Station: 0, Plus 1 Exam by:: MScott, RN  Labs: Lab Results  Component Value Date   WBC 9.0 11/14/2019   HGB 12.8 11/14/2019   HCT 38.2 11/14/2019   MCV 91.6 11/14/2019   PLT 180 11/14/2019    Assessment / Plan: Induction of labor due to maternal request,  progressing well on pitocin  Labor: Progressing normally; continue pushing Preeclampsia:  n/a Fetal Wellbeing:  Category I Pain Control:  Epidural I/D:  n/a Anticipated MOD:  NSVD  [name] 11/14/2019, 11:05 PM

## 2019-11-15 ENCOUNTER — Encounter (HOSPITAL_COMMUNITY): Payer: Self-pay | Admitting: Obstetrics & Gynecology

## 2019-11-15 LAB — CBC
HCT: 38.1 % (ref 36.0–46.0)
Hemoglobin: 12.8 g/dL (ref 12.0–15.0)
MCH: 30.8 pg (ref 26.0–34.0)
MCHC: 33.6 g/dL (ref 30.0–36.0)
MCV: 91.6 fL (ref 80.0–100.0)
Platelets: 161 10*3/uL (ref 150–400)
RBC: 4.16 MIL/uL (ref 3.87–5.11)
RDW: 13.2 % (ref 11.5–15.5)
WBC: 12.2 10*3/uL — ABNORMAL HIGH (ref 4.0–10.5)
nRBC: 0 % (ref 0.0–0.2)

## 2019-11-15 MED ORDER — COCONUT OIL OIL: 1.0000 "application " | TOPICAL_OIL | Status: DC | PRN

## 2019-11-15 MED ORDER — PRENATAL MULTIVITAMIN CH
1.0000 | ORAL_TABLET | Freq: Every day | ORAL | Status: DC
  Administered 2019-11-15 – 2019-11-16 (×2): 1 via ORAL
  Filled 2019-11-15 (×2): qty 1

## 2019-11-15 MED ORDER — WITCH HAZEL-GLYCERIN EX PADS: 1.0000 "application " | MEDICATED_PAD | CUTANEOUS | Status: DC | PRN

## 2019-11-15 MED ORDER — DIBUCAINE (PERIANAL) 1 % EX OINT: 1.0000 "application " | TOPICAL_OINTMENT | CUTANEOUS | Status: DC | PRN

## 2019-11-15 MED ORDER — DIPHENHYDRAMINE HCL 25 MG PO CAPS: 25.0000 mg | ORAL_CAPSULE | Freq: Four times a day (QID) | ORAL | Status: DC | PRN

## 2019-11-15 MED ORDER — ACETAMINOPHEN 325 MG PO TABS: 650.0000 mg | ORAL_TABLET | ORAL | Status: DC | PRN

## 2019-11-15 MED ORDER — SIMETHICONE 80 MG PO CHEW: 80.0000 mg | CHEWABLE_TABLET | ORAL | Status: DC | PRN

## 2019-11-15 MED ORDER — ONDANSETRON HCL 4 MG PO TABS: 4.0000 mg | ORAL_TABLET | ORAL | Status: DC | PRN

## 2019-11-15 MED ORDER — OXYCODONE-ACETAMINOPHEN 5-325 MG PO TABS: 1.0000 | ORAL_TABLET | ORAL | Status: DC | PRN

## 2019-11-15 MED ORDER — BENZOCAINE-MENTHOL 20-0.5 % EX AERO
1.0000 "application " | INHALATION_SPRAY | CUTANEOUS | Status: DC | PRN
  Filled 2019-11-15: qty 56

## 2019-11-15 MED ORDER — OXYCODONE-ACETAMINOPHEN 5-325 MG PO TABS: 2.0000 | ORAL_TABLET | ORAL | Status: DC | PRN

## 2019-11-15 MED ORDER — TETANUS-DIPHTH-ACELL PERTUSSIS 5-2.5-18.5 LF-MCG/0.5 IM SUSP: 0.5000 mL | Freq: Once | INTRAMUSCULAR | Status: DC

## 2019-11-15 MED ORDER — IBUPROFEN 600 MG PO TABS
600.0000 mg | ORAL_TABLET | Freq: Four times a day (QID) | ORAL | Status: DC
  Administered 2019-11-15 – 2019-11-16 (×6): 600 mg via ORAL
  Filled 2019-11-15 (×6): qty 1

## 2019-11-15 MED ORDER — SENNOSIDES-DOCUSATE SODIUM 8.6-50 MG PO TABS
2.0000 | ORAL_TABLET | ORAL | Status: DC
  Administered 2019-11-16: 2 via ORAL
  Filled 2019-11-15: qty 2

## 2019-11-15 MED ORDER — ONDANSETRON HCL 4 MG/2ML IJ SOLN: 4.0000 mg | INTRAMUSCULAR | Status: DC | PRN

## 2019-11-15 MED ORDER — ZOLPIDEM TARTRATE 5 MG PO TABS: 5.0000 mg | ORAL_TABLET | Freq: Every evening | ORAL | Status: DC | PRN

## 2019-11-15 NOTE — Lactation Note (Addendum)
This note was copied from a baby's chart. Lactation Consultation Note Baby 69 hrs old. New mom stating she BF the baby for 13 minutes and she doesn't know if he's hungry again. Reviewed hunger cues. Baby was cueing, suggested mom try to BF again. Discussed newborn feeding habits, STS, I&O, breast massage, supply and demand. Reviewed different positions, need for support and safety while feeding d/t getting sleeping while feeding.  Mom has pendulous breast w/everted nipple at the bottom of breast slightly turning downwards towards abd. Encouraged mom to support breast w/her hand not just pulling on her skin to place nipple in the baby's mouth.  Mom likes football hold. Baby has bad cephalohematoma to crown of head from vacuum. Discussed not touching head, support baby at base of neck.    Hand expression taught to opposite breast that baby was feeding on, and had recently BF on for 13 minutes. No colostrum noted. Mom stated she has seen some colostrum, not pouring just to tip of nipple.  Mom and FOB very tentative to [facility] No swallows heard at this time. Baby did have a nutritive feeding w/good jaw movements.  Encouraged mom to call for assistance or questions if needed.  Patient Name: Boy Towanda Roys [id number] Date: 11/15/2019 Reason for consult: Follow-up assessment;Primapara;Term   Maternal Data Has patient been taught Hand Expression?: Yes Does the patient have breastfeeding experience prior to this delivery?: No  Feeding Feeding Type: Breast Fed  LATCH Score Latch: Grasps breast easily, tongue down, lips flanged, rhythmical sucking.  Audible Swallowing: None  Type of Nipple: Everted at rest and after stimulation  Comfort (Breast/Nipple): Soft / non-tender  Hold (Positioning): Assistance needed to correctly position infant at breast and maintain latch.  LATCH Score: 7  Interventions Interventions: Breast feeding basics reviewed;Support pillows;Assisted with  latch;Position options;Skin to skin;Breast massage;Hand express;Breast compression;Adjust position  Lactation Tools Discussed/Used WIC Program: No   Consult Status Consult Status: Follow-up Date: 11/16/19 Follow-up type: In-patient    Theodoro Kalata 11/15/2019, 10:43 PM

## 2019-11-15 NOTE — Lactation Note (Signed)
This note was copied from a baby's chart. Lactation Consultation Note  Patient Name: Cassidy [name] [id number] Date: 11/15/2019 Reason for consult: Initial assessment;Term;Primapara Baby is 41 hours old.  Mom pushed for 3 hours and has been very tired.  She reports that baby latched twice using football hold. RN assisted mom with feedings both times.  Reviewed feeding cues and discussed first 24 hour behavior.  Instructed to feed with cues and call for assist prn.  Questions answered.  Breastfeeding consultation services information given and reviewed.  Maternal Data    Feeding Feeding Type: Breast Fed  LATCH Score Latch: Repeated attempts needed to sustain latch, nipple held in mouth throughout feeding, stimulation needed to elicit sucking reflex.  Audible Swallowing: A few with stimulation  Type of Nipple: Everted at rest and after stimulation  Comfort (Breast/Nipple): Soft / non-tender  Hold (Positioning): Assistance needed to correctly position infant at breast and maintain latch.  LATCH Score: 7  Interventions Interventions: Breast feeding basics reviewed;Assisted with latch;Skin to skin;Breast massage;Hand express;Adjust position;Support pillows  Lactation Tools Discussed/Used     Consult Status Consult Status: Follow-up Date: 11/16/19 Follow-up type: In-patient    Ave Filter 11/15/2019, 2:25 PM

## 2019-11-15 NOTE — Lactation Note (Signed)
This note was copied from a baby's chart. Lactation Consultation Note  Patient Name: Cassidy [name] [id number] Date: 11/15/2019  P1, 4 hour term female infant. Prior to [facility] entering room, RN alerted [place] not to enter room due to -patient preferring   [place] services in the morning . Per RN,  mom is overwhelmed and maternally exhausted and wants to sleep at this time.    Maternal Data    Feeding Feeding Type: Breast Fed  LATCH Score Latch: Repeated attempts needed to sustain latch, nipple held in mouth throughout feeding, stimulation needed to elicit sucking reflex.  Audible Swallowing: A few with stimulation  Type of Nipple: Everted at rest and after stimulation  Comfort (Breast/Nipple): Soft / non-tender  Hold (Positioning): Assistance needed to correctly position infant at breast and maintain latch.  LATCH Score: 7  Interventions Interventions: Breast feeding basics reviewed;Assisted with latch;Skin to skin;Hand express;Adjust position;Support pillows  Lactation Tools Discussed/Used     Consult Status      Vicente Serene 11/15/2019, 5:58 AM

## 2019-11-15 NOTE — Anesthesia Postprocedure Evaluation (Signed)
Anesthesia Post Note  Patient: Cassidy [name]  Procedure(s) Performed: AN AD HOC LABOR EPIDURAL     Patient location during evaluation: Mother Baby Anesthesia Type: Epidural Level of consciousness: awake and alert Pain management: pain level controlled Vital Signs Assessment: post-procedure vital signs reviewed and stable Respiratory status: spontaneous breathing, nonlabored ventilation and respiratory function stable Cardiovascular status: stable Postop Assessment: no headache, no backache and epidural receding Anesthetic complications: no    Last Vitals:  Vitals:   11/15/19 0300 11/15/19 0403  BP: (!) 110/56 114/77  Pulse: 92 (!) 105  Resp: 18 18  Temp:  36.8 C  SpO2:  100%    Last Pain:  Vitals:   11/15/19 0510  TempSrc:   PainSc: 3    Pain Goal:                   Angelica Frandsen

## 2019-11-16 MED ORDER — ACETAMINOPHEN 325 MG PO TABS: 650.0000 mg | ORAL_TABLET | Freq: Four times a day (QID) | ORAL | 0 refills | Status: AC | PRN

## 2019-11-16 MED ORDER — IBUPROFEN 600 MG PO TABS: 600.0000 mg | ORAL_TABLET | Freq: Four times a day (QID) | ORAL | 0 refills | Status: AC | PRN

## 2019-11-16 NOTE — Progress Notes (Signed)
Post Partum Day 1 Subjective: no complaints, up ad lib, voiding, tolerating PO and + flatus  Objective: Blood pressure 109/74, pulse 72, temperature 97.9 F (36.6 C), temperature source Oral, resp. rate 18, height 5\' 10"  (1.778 m), weight (!) 137 kg, SpO2 100 %, unknown if currently breastfeeding.  Physical Exam:  General: alert, cooperative and no distress Lochia: appropriate Uterine Fundus: firm Incision: healing well DVT Evaluation: No evidence of DVT seen on physical exam.  Recent Labs    11/14/19 0100 11/15/19 0610  HGB 12.8 12.8  HCT 38.2 38.1    Assessment/Plan: Discharge home  Wants to go home D/W circumcision of newborn female. Risks reviewed. She states she understands and agrees.   LOS: 2 days   Shon Millet II 11/16/2019, 7:27 AM

## 2019-11-16 NOTE — Discharge Summary (Signed)
Postpartum Discharge Summary  Date of Service updated5/27/21     Patient Name: Cassidy [name] DOB: [birth year] MRN: [id number]  Date of admission: 11/14/2019 Delivery date:11/15/2019  Delivering provider: [name]  Date of discharge: 11/16/2019  Admitting diagnosis: Pregnancy [Z34.90] Intrauterine pregnancy: [redacted]w[redacted]d    Secondary diagnosis:  Active Problems:   Pregnancy  Additional problems: none    Discharge diagnosis: Term Pregnancy Delivered                                              Post partum procedures:none Augmentation: Pitocin and Cytotec Complications: None  Hospital course: Onset of Labor With Vaginal Delivery      34y.o. yo G1P1001 at 428w2das admitted in Latent Labor on 11/14/2019. Patient had an uncomplicated labor course as follows:  Membrane Rupture Time/Date: 9:51 AM ,11/14/2019   Delivery Method:Vaginal, Vacuum (Extractor)  Episiotomy: None  Lacerations:  Vaginal;Periurethral  Patient had an uncomplicated postpartum course.  She is ambulating, tolerating a regular diet, passing flatus, and urinating well. Patient is discharged home in stable condition on 11/16/19.  Newborn Data: Birth date:11/15/2019  Birth time:1:38 AM  Gender:Female  Living status:Living  Apgars:9 ,9  Weight:3711 g   Magnesium Sulfate received: No BMZ received: No Rhophylac: MMR:No T-DaP:Given prenatally Flu: No Transfusion:No  Physical exam  Vitals:   11/15/19 0300 11/15/19 0403 11/15/19 1326 11/16/19 0539  BP: (!) 110/56 114/77 105/66 109/74  Pulse: 92 (!) 105 86 72  Resp: _0 Temp:  98.3 F (36.8 C)  97.9 F (36.6 C)  TempSrc:  Oral  Oral  SpO2:  100% 98% 100%  Weight:      Height:       General: alert, cooperative and no distress Lochia: appropriate Uterine Fundus: firm Incision: Healing well with no significant drainage DVT Evaluation: No evidence of DVT seen on physical exam. Labs: Lab Results  Component Value Date   WBC 12.2 (H) 11/15/2019    HGB 12.8 11/15/2019   HCT 38.1 11/15/2019   MCV 91.6 11/15/2019   PLT 161 11/15/2019   CMP Latest Ref Rng & Units 10/09/2015  Glucose 70 - 99 mg/dL -  BUN 6 - 23 mg/dL -  Creatinine 0.44 - 1.00 mg/dL 0.63  Sodium 135 - 145 mEq/L -  Potassium 3.5 - 5.1 mEq/L -  Chloride 96 - 112 mEq/L -  CO2 19 - 32 mEq/L -  Calcium 8.4 - 10.5 mg/dL -  Total Protein 6.0 - 8.3 g/dL -  Total Bilirubin 0.2 - 1.2 mg/dL -  Alkaline Phos 39 - 117 U/L -  AST 0 - 37 U/L -  ALT 0 - 35 U/L -   Edinburgh Score: Edinburgh Postnatal Depression Scale Screening Tool 11/15/2019  I have been able to laugh and see the funny side of things. 0  I have looked forward with enjoyment to things. 0  I have blamed myself unnecessarily when things went wrong. 1  I have been anxious or worried for no good reason. 1  I have felt scared or panicky for no good reason. 1  Things have been getting on top of me. 1  I have been so unhappy that I have had difficulty sleeping. 0  I have felt sad or miserable. 0  I have been so unhappy that I have been crying. 1  The thought of harming myself has occurred to me. 0  Edinburgh Postnatal Depression Scale Total 5      After visit meds:  Allergies as of 11/16/2019      Reactions   Oxycodone-acetaminophen Other (See Comments)   Flushing      Medication List    STOP taking these medications   HYDROcodone-acetaminophen 5-325 MG tablet Commonly known as: Norco     TAKE these medications   acetaminophen 325 MG tablet Commonly known as: Tylenol Take 2 tablets (650 mg total) by mouth every 6 (six) hours as needed (for pain scale < 4).   fexofenadine 180 MG tablet Commonly known as: ALLEGRA Take 180 mg by mouth daily as needed for allergies.   ibuprofen 600 MG tablet Commonly known as: ADVIL Take 1 tablet (600 mg total) by mouth every 6 (six) hours as needed.   prenatal multivitamin Tabs tablet Take 1 tablet by mouth daily at 12 noon.        Discharge home in  stable condition Infant Feeding: Breast Infant Disposition:home with mother Discharge instruction: per After Visit Summary and Postpartum booklet. Activity: Advance as tolerated. Pelvic rest for 6 weeks.  Diet: routine diet Anticipated Birth Control: Unsure Postpartum Appointment:6 weeks Additional Postpartum F/U: none Future Appointments:No future appointments. Follow up Visit:      11/16/2019 Allena Katz, MD

## 2019-11-16 NOTE — Lactation Note (Signed)
This note was copied from a baby's chart. Lactation Consultation Note  Patient Name: Cassidy [name] [id number] Date: 11/16/2019 Reason for consult: Follow-up assessment  P1 mother whose infant is now 8 hours old.  This is a term baby at 40+2 weeks.  Baby had a circumcision this a.m.  Baby was asleep in mother's arms when I arrived.  Mother had a concern regarding if her son is "getting enough" when he feeds.  She stated that he seems hungry all the time.  Reviewed basic breast feeding information.  Discussed milk "coming to volume" and how to express/pump to help establish a good milk supply.  Suggested mother feed back any EBM she obtains to baby as soon as she is able to.  Discussed milk storage times.  Mother is somewhat anxious and I helped to relieve her anxieties by education and emotional support.  Discussed realistic feeding goals in the next few days.    Encouraged to continue feeding 8-12 times/24 hours or sooner if baby shows cues.  She will supplement with her EBM and will pump with her DEBP after discharge to help with milk supply and supplementation.  Explained about feeding goals after circumcision and that baby may awaken often during the night tonight to feed.  Mother verbalized understanding.  Engorgement prevention/treatment reviewed.  Demonstrated manual pump and mother also has a DEBP for home use.  Father present.  Both parents very receptive to learning.  Offered to return to observe a latch prior to discharge if mother is interested.    Mother has our OP phone number for questions/concerns after discharge.   Maternal Data    Feeding    LATCH Score                   Interventions    Lactation Tools Discussed/Used     Consult Status Consult Status: Complete Follow-up type: In-patient    Imir Brumbach R Laura-Lee Villegas 11/16/2019, 10:07 AM

## 2019-11-17 ENCOUNTER — Ambulatory Visit: Payer: Self-pay

## 2019-11-17 NOTE — Lactation Note (Addendum)
This note was copied from a baby's chart. Lactation Consultation Note  Patient Name: Cassidy [name] [id number] Date: 11/17/2019 Reason for consult: Mother's request;1st time breastfeeding;Term;Infant weight loss P1, 50 hour term female infant, -6% weight loss. Infant had 8 stools and 9 voids since birth. LC entered room mom was crying and felt overwhelm and wanted help , LC notice formula on counter, per dad , they attempt to offer formula but infant tongue trusted bottle nipple out of his mouth and refused to take it. Mom has recently finished breastfeeding infant for 20 minutes prior to [facility] entering the room. LC notice infant was still cuing, LC assisted mom with hand expressing 72ml expressed and given to infant, LC placed 10 mls of formula that was open in foley cup with dad assistance infant took about 9 mls spilling noted. Mom was given the breastfeeding supplemental sheet earlier, mom agreeable to re-latch infant at breast, mom latched infant on left breast using the football hold position, infant latched without difficult, and was supplemented with additional 10 mls of formula at breast with curve tip syringe, dad help with supplementing infant at breast  as LC observed, infant breastfeed another 10 minutes, dad took infant and did STS, infant fell asleep after few minutes. Per dad, that is the first time he feels infant  has slept  since birth. Per mom, she feels better now about breastfeeding. While dad was doing STS with infant , LC assisted mom in using DEBP, mom understands to pump every 3 hours for 15 minutes on initial setting. Mom was still pumping when LC left the room. Mom shown how to use DEBP & how to disassemble, clean, & reassemble parts. Parents plan: 1. Mom will breastfeed according to hunger cues, 8 to 12+ times within 24 hours and not exceed 3 hours without feeding infant. 2. Dad will assist mom in supplementing infant at breast with either EBM/ and or formula according to  infant's age/ hour until mom's milk comes to volume and infant's weight is stable. 3. Mom will use DEBP every 3 hours for 15 minutes on initial setting and give infant back any EBM. 4. Mom knows to call RN or LC if she has any questions, concerns or need assistance with latching infant at breast.  Maternal Data    Feeding Feeding Type: Breast Fed  LATCH Score Latch: Grasps breast easily, tongue down, lips flanged, rhythmical sucking.(Infant took 10 mls with curve tip syringe at breast.)  Audible Swallowing: Spontaneous and intermittent  Type of Nipple: Everted at rest and after stimulation  Comfort (Breast/Nipple): Soft / non-tender  Hold (Positioning): Assistance needed to correctly position infant at breast and maintain latch.  LATCH Score: 9  Interventions Interventions: Expressed milk;Hand express;Support pillows;Adjust position;Skin to skin;Assisted with latch;DEBP  Lactation Tools Discussed/Used Tools: Pump Breast pump type: Double-Electric Breast Pump Pump Review: Setup, frequency, and cleaning;Milk Storage Initiated by:: Vicente Serene, IBCLC Date initiated:: 11/17/19   Consult Status Consult Status: Follow-up Date: 11/17/19 Follow-up type: In-patient    Vicente Serene 11/17/2019, 4:17 AM

## 2019-11-17 NOTE — Lactation Note (Signed)
This note was copied from a baby's chart. Lactation Consultation Note  Patient Name: Boy Khandi Demauro [id number] Date: 11/17/2019 Reason for consult: Follow-up assessment   P1, Baby 54 hours old.  8.8% weight loss. Baby was circumcised yesterday and became fussy and parents started supplementing w/ formula. Observed breastfeeding in both football and cross cradle hold.  Baby fed well with intermittent swallows. Mother pumping and received approx 5 ml.  Suggest mother continue post pumping 3-4 times per day and giving volume back to baby until weight is stable. Mother has DEBP at home. Feed on demand with cues.  Goal 8-12+ times per day after first 24 hrs.  Place baby STS if not cueing.  Reviewed engorgement care and monitoring voids/stools. Suggest calling for OP appt if mother would like next week.    Maternal Data    Feeding Feeding Type: Breast Fed  LATCH Score Latch: Grasps breast easily, tongue down, lips flanged, rhythmical sucking.  Audible Swallowing: A few with stimulation  Type of Nipple: Everted at rest and after stimulation  Comfort (Breast/Nipple): Soft / non-tender  Hold (Positioning): Assistance needed to correctly position infant at breast and maintain latch.  LATCH Score: 8  Interventions Interventions: Breast feeding basics reviewed;Hand express;DEBP  Lactation Tools Discussed/Used     Consult Status Consult Status: Complete Date: 11/17/19    Vivianne Master [facility] 11/17/2019, 1:13 PM

## 2019-11-19 ENCOUNTER — Ambulatory Visit: Payer: Self-pay

## 2019-11-19 NOTE — Lactation Note (Addendum)
This note was copied from a baby's chart. Lactation Consultation Note  Patient Name: Cassidy [name] [id number] Date: 11/19/2019    [facility] The Note:  Call received from [facility] regarding a consult today for engorgement.  Asked if RN could manage with ice packs until a lactation consultant could assist.  RN has been assisting in the management and mother has been massaging and pumping.   Maternal Data    Feeding Feeding Type: Bottle Fed - Formula  LATCH Score                   Interventions    Lactation Tools Discussed/Used     Consult Status      Guillermina Shaft R Jerri Hargadon 11/19/2019, 12:19 PM

## 2019-11-19 NOTE — Lactation Note (Signed)
This note was copied from a baby's chart. Lactation Consultation Note  Patient Name: Cassidy [name] [id number] Date: 11/19/2019 Reason for consult: Follow-up assessment;Engorgement   Maternal Data  Baby 4 days old, per mom baby admitted for elevated temp and to r/o sepsis. Mom sitting in room with ice packs to breast, dad sitting in recliner, baby asleep in bassinet. Mom is engorged, reports h/o feeding baby on demand/ cue based feeding, however yesterday reports only pumped 1x and nursed 1x d/t temperature concerns of baby. Mom pumped at 11am (first time since baby admitted), obtained 70ml total of EBM, baby has been receiving formula and EBM per mom's request. On assessment breasts are firm, warm, mild erythema noted ~3inches around areola bilat, healing scab noted to center of right nipple. Mom prefers to pump while baby in the hospital vs latching to breast. Resized mom for 55mm flange, advised warm packs to breasts bilat for ~10mins prior to pump session, continue to pump per schedule q3hrs or sooner for relief, massage firm spots while pumping, apply ice pack 10-85min after pumping as needed for comfort. Discussed how to clean pump parts, storage of EBM, offer baby EBM first vs mixing formula and EBM in same bottle, expect improvement with engorgement over next 24hrs, if with red streaks to breast and/or flu like s/s and/ or fever notify RN or [place]. Mom voiced understanding, agreeable with plan and with no further concerns. Notified Clarene Critchley, RN of plan and requested mother to have coconut oil for nipples/flange as needed. BGilliam, RN, IBCLC  Feeding Feeding Type: Bottle Fed - Breast Milk  LATCH Score                   Interventions Interventions: Breast massage;Expressed milk;DEBP;Ice  Lactation Tools Discussed/Used     Consult Status Consult Status: PRN Date: 11/19/19 Follow-up type: Call as needed    Bernita Buffy 11/19/2019, 4:29 PM

## 2019-11-19 NOTE — Lactation Note (Signed)
This note was copied from a baby's chart. Lactation Consultation Note  Patient Name: Cassidy [name] [id number] Date: 11/19/2019 Reason for consult: Follow-up assessment   LC Follow Up:  Called pediatric RN; she was busy but spoke with another RN about the [facility] consult.  Informed her that we are still doing discharges and a lactation consultant will be rounding after lunch.    Consult Status Consult Status: PRN Date: 11/19/19 Follow-up type: Call as needed    Jarita Raval R Donovan Persley 11/19/2019, 1:17 PM

## 2019-11-20 ENCOUNTER — Ambulatory Visit: Payer: Self-pay

## 2019-11-20 NOTE — Lactation Note (Signed)
This note was copied from a baby's chart. Lactation Consultation Note  Patient Name: Milady Bethards Today's Date: 11/20/2019   Mom pumping on arrival.  No sprays noted, just drops. Mom getting more from right breast vs left.  Noteable red area on left breast in outer middle quadrant.  Asked mom if I could touch her breast.  Mom in agreement.Showed mom how to use the coconut oil down inside the flanges to make pumping more comfortable.  Hard areas on side of left breast.  Urged gentle massage/heat.  Mom reports she doesn't feel bad but the area is painful. Area still hard after a few minutes of heat/massage/ and pumping. Tried some reverse pressure softening and then hand expression and pumping. Mom started crying when discussing that area could put her more at risk for mastitis or plugged ducts.  Area staying reddened and hard.  Urged mom to take a warm shower and really focus on massage in that area. Urged to do ice in between and keep checking that breast. Mom reports she just plans to bottle feed right now. Discussed amounts infant should be taking at 5 days since mom is exclusively bottle feeding now.  Parents report the do not have a guideline.  RN to get them feeding recommendations for amounts for age. Lactation to follow up with mom tomorrow.  Maternal Data    Feeding    LATCH Score                   Interventions    Lactation Tools Discussed/Used     Consult Status      Lelania Bia Thompson Caul 11/20/2019, 4:05 PM

## 2019-11-21 ENCOUNTER — Ambulatory Visit: Payer: Self-pay

## 2019-11-21 NOTE — Lactation Note (Addendum)
This note was copied from a baby's chart. Lactation Consultation Note  Patient Name: Dacoda Teta Today's Date: 11/21/2019   Spoke with mother of 24 day old infant.  Baby was admitted to Pediatrics due to a fever which has now resolved.  In the emergency room, mother only breastfed infant once and emptied her breast once with a pump for approx 24 hours. After that a red, hard area began on her L outer aspect of her breast. She is now pumping q 2 hours but red, hard area remains after 2 days. She states she is massaging area during pumping to try to empty.  She has been pumping 60 ml.  Her last pumping session she pumped 30 ml. Suggest mother call her OB/GYN to discuss being treated for mastitis. Also suggest latching baby in cross cradle on L breast to help breast empty. Mother asked if the breastmilk could hurt her baby.  Provided education regarding infection and breastmilk would be beneficial to baby.  Mother has DEBP at home. LC will follow up with mother later today.       Maternal Data    Feeding Feeding Type: Bottle Fed - Breast Milk Nipple Type: Slow - flow  LATCH Score                   Interventions    Lactation Tools Discussed/Used     Consult Status      Carlye Grippe 11/21/2019, 8:23 AM

## 2020-04-18 ENCOUNTER — Other Ambulatory Visit: Payer: Self-pay | Admitting: Family Medicine

## 2020-04-18 DIAGNOSIS — E041 Nontoxic single thyroid nodule: Secondary | ICD-10-CM

## 2020-05-01 ENCOUNTER — Other Ambulatory Visit: Payer: Managed Care, Other (non HMO)

## 2020-05-08 ENCOUNTER — Ambulatory Visit
Admission: RE | Admit: 2020-05-08 | Discharge: 2020-05-08 | Disposition: A | Discharge status: Home / Self Care | Payer: Managed Care, Other (non HMO) | Source: Ambulatory Visit | Attending: Family Medicine | Admitting: Family Medicine

## 2020-05-08 DIAGNOSIS — E041 Nontoxic single thyroid nodule: Secondary | ICD-10-CM
